# Patient Record
Sex: Male | Born: 1945 | Race: Black or African American | Hispanic: No | Marital: Married | Smoking: Never smoker
Health system: Southern US, Community
[De-identification: ages and names within clinical notes are randomized; demographics above are authoritative.]

## PROBLEM LIST (undated history)

## (undated) DIAGNOSIS — E785 Hyperlipidemia, unspecified: Secondary | ICD-10-CM

## (undated) DIAGNOSIS — K219 Gastro-esophageal reflux disease without esophagitis: Secondary | ICD-10-CM

## (undated) DIAGNOSIS — F32A Depression, unspecified: Secondary | ICD-10-CM

## (undated) DIAGNOSIS — F329 Major depressive disorder, single episode, unspecified: Secondary | ICD-10-CM

## (undated) DIAGNOSIS — G4730 Sleep apnea, unspecified: Secondary | ICD-10-CM

## (undated) HISTORY — PX: THUMB ARTHROSCOPY: SHX2509

## (undated) HISTORY — DX: Sleep apnea, unspecified: G47.30

## (undated) HISTORY — DX: Hyperlipidemia, unspecified: E78.5

## (undated) HISTORY — PX: ANKLE FRACTURE SURGERY: SHX122

## (undated) HISTORY — PX: FEMUR SURGERY: SHX943

## (undated) HISTORY — DX: Gastro-esophageal reflux disease without esophagitis: K21.9

---

## 2000-06-12 ENCOUNTER — Ambulatory Visit (HOSPITAL_COMMUNITY): Admission: RE | Admit: 2000-06-12 | Discharge: 2000-06-12 | Payer: Self-pay | Admitting: *Deleted

## 2000-06-12 ENCOUNTER — Encounter: Payer: Self-pay | Admitting: *Deleted

## 2004-02-02 ENCOUNTER — Emergency Department (HOSPITAL_COMMUNITY): Admission: EM | Admit: 2004-02-02 | Discharge: 2004-02-03 | Payer: Self-pay | Admitting: Emergency Medicine

## 2004-07-05 ENCOUNTER — Encounter: Admission: RE | Admit: 2004-07-05 | Discharge: 2004-07-05 | Payer: Self-pay | Admitting: Family Medicine

## 2004-07-20 ENCOUNTER — Ambulatory Visit (HOSPITAL_BASED_OUTPATIENT_CLINIC_OR_DEPARTMENT_OTHER): Admission: RE | Admit: 2004-07-20 | Discharge: 2004-07-20 | Payer: Self-pay | Admitting: Family Medicine

## 2004-07-22 ENCOUNTER — Ambulatory Visit: Payer: Self-pay | Admitting: Internal Medicine

## 2006-09-22 ENCOUNTER — Encounter: Admission: RE | Admit: 2006-09-22 | Discharge: 2006-09-22 | Payer: Self-pay | Admitting: Orthopedic Surgery

## 2010-06-29 NOTE — Procedures (Signed)
Dalton Kidd, PROM NO.:  [id number]   MEDICAL RECORD NO.:  [id number]          PATIENT TYPE:  OUT   LOCATION:  SLEEP CENTER                 FACILITY:  [facility]   PHYSICIAN:  Clinton D. Maple Hudson, M.D. DATE OF BIRTH:  04/20/1945   DATE OF STUDY:                              NOCTURNAL POLYSOMNOGRAM   REFERRING PHYSICIAN:  Talmadge Coventry, M.D.   INDICATIONS FOR STUDY:  Hypersomnia with sleep apnea.   Epworth Sleepiness Score 13/24, BMI 24, weight 176 pounds.   SLEEP ARCHITECTURE:  Total sleep time 377 minutes.  Sleep efficiency 92%.  Stage 1 was 3%; stage 2, 72%; stages 3 and 4 absent.  REM 25% of total sleep  time.  Sleep latency 4.5 minutes, REM latency 48 minutes.  Awake after sleep  onset 29 minutes. Arousal index 8.7.   RESPIRATORY DATA:  Respiratory disturbance index (RDI, AHI) 4.9 obstructive  events per hours which is within the upper limits of adult normal.  There  was 1 central apnea, 6 obstructive apneas, and 24 hypopneas.  The events  were not positional.  REM RDI 10.2.  Criteria for CPAP titration by split  protocol were not met.   OXYGEN DATA:  Moderate snoring with oxygen desaturation to a nadir of 84%.  Mean oxygen saturation through the study was 96% on room air.   CARDIAC DATA:  Normal sinus rhythm and sinus bradycardia, 50 to 61 beats per  minute.   MOVEMENT/PARASOMNIA:  A total of 34 limb jerks were recorded of which 9 were  associated with arousal or awakening or a periodic limb movement with  arousal index of 1.4 per hour which is relatively insignificant.   IMPRESSION/RECOMMENDATIONS:  1.  Occasional obstructive sleep disorder breathing events, RDI 4.9 per      hour, which is within the upper limits of      adult normal with no specific therapy indicated.  2.  Moderate snoring with oxygen desaturation 84%, mean saturation 96%.      Clinton D. Maple Hudson, M.D.  Diplomat    CDY/MEDQ  D:  07/22/2004 12:21:51  T:  07/22/2004 20:23:05   Job:  [id number]   cc:   Talmadge Coventry, M.D.  [address]  [place]  [place] [address]  Fax: [phone]

## 2010-08-19 ENCOUNTER — Emergency Department (HOSPITAL_COMMUNITY)
Admission: EM | Admit: 2010-08-19 | Discharge: 2010-08-19 | Disposition: A | Discharge status: Home / Self Care | Attending: Emergency Medicine | Admitting: Emergency Medicine

## 2010-08-19 DIAGNOSIS — Y9353 Activity, golf: Secondary | ICD-10-CM | POA: Insufficient documentation

## 2010-08-19 DIAGNOSIS — S058X9A Other injuries of unspecified eye and orbit, initial encounter: Secondary | ICD-10-CM | POA: Insufficient documentation

## 2010-08-19 DIAGNOSIS — IMO0002 Reserved for concepts with insufficient information to code with codable children: Secondary | ICD-10-CM | POA: Insufficient documentation

## 2013-12-03 ENCOUNTER — Ambulatory Visit (INDEPENDENT_AMBULATORY_CARE_PROVIDER_SITE_OTHER): Payer: Medicare Other | Admitting: Podiatry

## 2013-12-03 ENCOUNTER — Ambulatory Visit (INDEPENDENT_AMBULATORY_CARE_PROVIDER_SITE_OTHER): Payer: Medicare Other

## 2013-12-03 ENCOUNTER — Encounter: Payer: Self-pay | Admitting: Podiatry

## 2013-12-03 VITALS — BP 112/73 | HR 66 | Resp 16 | Ht 70.0 in | Wt 178.0 lb

## 2013-12-03 DIAGNOSIS — M21612 Bunion of left foot: Secondary | ICD-10-CM

## 2013-12-03 DIAGNOSIS — M2042 Other hammer toe(s) (acquired), left foot: Secondary | ICD-10-CM

## 2013-12-03 DIAGNOSIS — M2012 Hallux valgus (acquired), left foot: Secondary | ICD-10-CM

## 2013-12-03 DIAGNOSIS — M779 Enthesopathy, unspecified: Secondary | ICD-10-CM

## 2013-12-03 NOTE — Progress Notes (Signed)
   Subjective:    Patient ID: Dalton Kidd, male    DOB: 12/25/1945, 68 y.o.   MRN: [id number]  HPI Comments: "I have pain underneath."  Presents the office today with complaints of left foot pain. He states to the area submetatarsal 2. He states that he has pain in this area particularly with pressure and walking. He states that the area on the bottom of his foot feels thin and there is no padding. States the second digit is overlapping his big toe which makes his shoes uncomfortable. He said no prior treatment. He previously has had inserts made by the [facility]. Denies any recent injury or trauma to the area. No other complaints at this time.  Foot Pain Associated symptoms include arthralgias.      Review of Systems  Endocrine: Positive for polyuria.  Musculoskeletal: Positive for arthralgias and back pain.  All other systems reviewed and are negative.      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 This significant contracture of the second digit. There is prominence of the second metatarsal head plantarly. There is atrophy of the plantar fat pad. There is mild tenderness to palpation over the second metatarsal head plantarly. No pain with vibratory sensation. There is no pain or crepitation within the first MTPJ. No pain with range of motion of the second MPJ. MMT 5/5, ROM WNL No calf pain with compression, swelling, warmth. No open lesions.        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 -X-rays were obtained and reviewed the patient. -Conservative versus surgical treatment discussed including alternatives,  risks, complications. -At this time we'll proceed with conservative treatment. Offloading pads were dispensed to help offload the prominent metatarsal head plantarly. Also discussed the patient he can take his inserts to wear they were made to have them modified to help offload the area. Also discussed a higher toe box shoe. -Patient does state that if symptoms persist he would like to proceed with surgical intervention. Discussed with him this would most likely require bunion correction involving distal procedure with possible Akin osteotomy, second metatarsal shortening osteotomy and second digit hammertoe repair. -Followup as needed. Call the office with any questions, concerns, change in symptoms.

## 2013-12-04 ENCOUNTER — Encounter: Payer: Self-pay | Admitting: Podiatry

## 2013-12-04 NOTE — Patient Instructions (Signed)
Bunionectomy A bunionectomy is surgery to remove a bunion. A bunion is an enlargement of the joint at the base of the big toe. It is made up of bone and soft tissue on the inside part of the joint. Over time, a painful lump appears on the inside of the joint. The big toe begins to point inward toward the second toe. New bone growth can occur and a bone spur may form. The pain eventually causes difficulty walking. A bunion usually results from inflammation caused by the irritation of poorly fitting shoes. It often begins later in life. A bunionectomy is performed when nonsurgical treatment no longer works. When surgery is needed, the extent of the procedure will depend on the degree of deformity of the foot. Your surgeon will discuss with you the different procedures and what will work best for you depending on your age and health. LET YOUR CAREGIVER KNOW ABOUT:   Previous problems with anesthetics or medicines used to numb the skin.  Allergies to dyes, iodine, foods, and/or latex.  Medicines taken including herbs, eye drops, prescription medicines (especially medicines used to "thin the blood"), aspirin and other over-the-counter medicines, and steroids (by mouth or as a cream).  History of bleeding or blood problems.  Possibility of pregnancy, if this applies.  History of blood clots in your legs and/or lungs .  Previous surgery.  Other important health problems. RISKS AND COMPLICATIONS   Infection.  Pain.  Nerve damage.  Possibility that the bunion will recur. BEFORE THE PROCEDURE  You should be present 60 minutes prior to your procedure or as directed.  PROCEDURE  Surgery is often done so that you can go home the same day (outpatient). It may be done in a hospital or in an outpatient surgical center. An anesthetic will be used to help you sleep during the procedure. Sometimes, a spinal anesthetic is used to make you numb below the waist. A cut (incision) is made over the swollen  area at the first joint of the big toe. The enlarged lump will be removed. If there is a need to reposition the bones of the big toe, this may require more than 1 incision. The bone itself may need to be cut. Screws and wires may be used in the repair. These can be removed at a later date. In severe cases, the entire joint may need to be removed and a joint replacement inserted. When done, the incision is closed with stitches (sutures). Skin adhesive strips may be added for reinforcement. They help hold the incision closed.  AFTER THE PROCEDURE  Compression bandages (dressings) are then wrapped around the wound. This helps to keep the foot in alignment and reduce swelling. Your foot will be monitored for bleeding and swelling. You will need to stay for a few hours in the recovery area before being discharged. This allows time for the anesthesia to wear off. You will be discharged home when you are awake, stable, and doing well. HOME CARE INSTRUCTIONS   You can expect to return to normal activities within 6 to 8 weeks after surgery. The foot is at increased risk for swelling for several months. When you can expect to bear weight on the operated foot will depend on the extent of your surgery. The milder the deformity, the less tissue is removed and the sooner the return to normal activity level. During the recovery period, a special shoe, boot, or cast may be worn to accommodate the surgical bandage and to help provide stability   to the foot.  Once you are home, an ice pack applied to the operative site may help with discomfort and keep swelling down. Stop using the ice if it causes discomfort.  Keep your feet raised (elevated) when possible to lessen swelling.  If you have an elastic bandage on your foot and you have numbness, tingling, or your foot becomes cold and blue, adjust the bandage to make it comfortable.  Change dressings as directed.  Keep the wound dry and clean. The wound may be washed  gently with soap and water. Gently blot dry without rubbing. Do not take baths or use swimming pools or hot tubs for 10 days, or as instructed by your caregiver.  Only take over-the-counter or prescription medicines for pain, discomfort, or fever as directed by your caregiver.  You may continue a normal diet as directed.  For activity, use crutches with no weight bearing or your orthopedic shoe as directed. Continue to use crutches or a cane as directed until you can stand without causing pain. SEEK MEDICAL CARE IF:   You have redness, swelling, bruising, or increasing pain in the wound.  There is pus coming from the wound.  You have drainage from a wound lasting longer than 1 day.  You have an oral temperature above 102 F (38.9 C).  You notice a bad smell coming from the wound or dressing.  The wound breaks open after sutures have been removed.  You develop dizzy episodes or fainting while standing.  You have persistent nausea or vomiting.  Your toes become cold.  Pain is not relieved with medicines. SEEK IMMEDIATE MEDICAL CARE IF:   You develop a rash.  You have difficulty breathing.  You develop any reaction or side effects to medicines given.  Your toes are numb or blue, or you have severe pain. MAKE SURE YOU:   Understand these instructions.  Will watch your condition.  Will get help right away if you are not doing well or get worse. Document Released: 01/11/2005 Document Revised: 04/22/2011 Document Reviewed: 02/16/2007 ExitCare Patient Information 2015 [employer]. This information is not intended to replace advice given to you by your health care provider. Make sure you discuss any questions you have with your health care provider.  

## 2014-07-02 ENCOUNTER — Emergency Department (HOSPITAL_COMMUNITY)
Admission: EM | Admit: 2014-07-02 | Discharge: 2014-07-02 | Disposition: A | Discharge status: Home / Self Care | Payer: Medicare Other | Attending: Emergency Medicine | Admitting: Emergency Medicine

## 2014-07-02 ENCOUNTER — Encounter (HOSPITAL_COMMUNITY): Payer: Self-pay | Admitting: *Deleted

## 2014-07-02 DIAGNOSIS — X58XXXA Exposure to other specified factors, initial encounter: Secondary | ICD-10-CM | POA: Insufficient documentation

## 2014-07-02 DIAGNOSIS — S0502XA Injury of conjunctiva and corneal abrasion without foreign body, left eye, initial encounter: Secondary | ICD-10-CM

## 2014-07-02 DIAGNOSIS — F329 Major depressive disorder, single episode, unspecified: Secondary | ICD-10-CM | POA: Diagnosis not present

## 2014-07-02 DIAGNOSIS — Y9389 Activity, other specified: Secondary | ICD-10-CM | POA: Diagnosis not present

## 2014-07-02 DIAGNOSIS — Y998 Other external cause status: Secondary | ICD-10-CM | POA: Diagnosis not present

## 2014-07-02 DIAGNOSIS — Z79899 Other long term (current) drug therapy: Secondary | ICD-10-CM | POA: Insufficient documentation

## 2014-07-02 DIAGNOSIS — S0592XA Unspecified injury of left eye and orbit, initial encounter: Secondary | ICD-10-CM | POA: Diagnosis present

## 2014-07-02 DIAGNOSIS — Y9289 Other specified places as the place of occurrence of the external cause: Secondary | ICD-10-CM | POA: Insufficient documentation

## 2014-07-02 HISTORY — DX: Major depressive disorder, single episode, unspecified: F32.9

## 2014-07-02 HISTORY — DX: Depression, unspecified: F32.A

## 2014-07-02 MED ORDER — ERYTHROMYCIN 5 MG/GM OP OINT
TOPICAL_OINTMENT | Freq: Four times a day (QID) | OPHTHALMIC | Status: DC
  Administered 2014-07-02: 11:00:00 via OPHTHALMIC
  Filled 2014-07-02: qty 3.5

## 2014-07-02 MED ORDER — FLUORESCEIN SODIUM 1 MG OP STRP
1.0000 | ORAL_STRIP | Freq: Once | OPHTHALMIC | Status: AC
  Administered 2014-07-02: 1 via OPHTHALMIC
  Filled 2014-07-02: qty 1

## 2014-07-02 MED ORDER — TETRACAINE HCL 0.5 % OP SOLN
2.0000 [drp] | Freq: Once | OPHTHALMIC | Status: AC
  Administered 2014-07-02: 2 [drp] via OPHTHALMIC
  Filled 2014-07-02: qty 2

## 2014-07-02 NOTE — ED Provider Notes (Signed)
CSN: [id number]     Arrival date & time 07/02/14  0815 History   First MD Initiated Contact with Patient 07/02/14 [phone]     Chief Complaint  Patient presents with  . Eye Pain     (Consider location/radiation/quality/duration/timing/severity/associated sxs/prior Treatment) HPI Dalton Kidd is a 69 year old male with past medical history of depression who presents the ER complaining of left irritation. Patient reports a gradual onset of the symptoms and the sensation of foreign body from the past 2 days in his left eye. Patient reports associated clear drainage and irritation when blinking. Patient denies visual acuity change, headache, eye pain, blurred vision, dizziness, weakness, loss of vision, fever. Patient reports mild photophobia.  Past Medical History  Diagnosis Date  . Depression    Past Surgical History  Procedure Laterality Date  . Ankle fracture surgery     No family history on file. History  Substance Use Topics  . Smoking status: Never Smoker   . Smokeless tobacco: Not on file  . Alcohol Use: No    Review of Systems  Constitutional: Negative for fever.  Eyes: Positive for photophobia, discharge, redness and itching. Negative for visual disturbance.  Respiratory: Negative for shortness of breath.   Cardiovascular: Negative for chest pain.  Gastrointestinal: Negative for nausea, vomiting and abdominal pain.  Genitourinary: Negative for dysuria.  Skin: Negative for rash.  Neurological: Negative for dizziness, syncope, weakness and numbness.  Psychiatric/Behavioral: Negative.       Allergies  Review of patient's allergies indicates no known allergies.  Home Medications   Prior to Admission medications   Medication Sig Start Date End Date Taking? Authorizing Provider  Artificial Tear Ointment (ARTIFICIAL TEARS) ointment Place 1 application into both eyes at bedtime.   Yes Historical Provider, MD  buPROPion (WELLBUTRIN XL) 300 MG 24 hr tablet Take 300 mg by  mouth daily.   Yes Historical Provider, MD  carboxymethylcellulose (REFRESH PLUS) 0.5 % SOLN Place 1 drop into both eyes 3 (three) times daily as needed (for dry eyes).   Yes Historical Provider, MD  cycloSPORINE (RESTASIS) 0.05 % ophthalmic emulsion Place 1 drop into both eyes 2 (two) times daily.   Yes Historical Provider, MD  loratadine (CLARITIN) 10 MG tablet Take 10 mg by mouth daily.   Yes Historical Provider, MD  Multiple Vitamin (MULTIVITAMIN WITH MINERALS) TABS tablet Take 1 tablet by mouth daily.   Yes Historical Provider, MD   BP 112/67 mmHg  Pulse 63  Temp(Src) 97.6 F (36.4 C) (Oral)  Resp 17  SpO2 99% Physical Exam  Constitutional: He is oriented to person, place, and time. He appears well-developed and well-nourished. No distress.  HENT:  Head: Normocephalic and atraumatic.  Eyes: EOM are normal. Pupils are equal, round, and reactive to light. Lids are everted and swept, no foreign bodies found. Right eye exhibits no discharge. Left eye exhibits discharge. Left eye exhibits no chemosis, no exudate and no hordeolum. No foreign body present in the left eye. Left conjunctiva is injected. Left conjunctiva has no hemorrhage. No scleral icterus. Right eye exhibits normal extraocular motion and no nystagmus. Left eye exhibits normal extraocular motion and no nystagmus.  Slit lamp exam:      The right eye shows no fluorescein uptake and no anterior chamber bulge.       The left eye shows corneal abrasion and fluorescein uptake. The left eye shows no corneal flare, no corneal ulcer, no foreign body, no hyphema, no hypopyon and no anterior chamber bulge.  Small,  punctate lesion of forcing uptake noted on lower pole of the cornea. EOMs intact. Pupils equal round reactive to light. No consensual photophobia.  Neck: Normal range of motion and full passive range of motion without pain. Neck supple. No spinous process tenderness and no muscular tenderness present. No rigidity. No edema, no  erythema and normal range of motion present. No Brudzinski's sign and no Kernig's sign noted.  Pulmonary/Chest: Effort normal. No respiratory distress.  Musculoskeletal: Normal range of motion.  Neurological: He is alert and oriented to person, place, and time.  Skin: Skin is warm and dry. He is not diaphoretic.  Psychiatric: He has a normal mood and affect.  Nursing note and vitals reviewed.   ED Course  Procedures (including critical care time) Labs Review Labs Reviewed - No data to display  Imaging Review No results found.   EKG Interpretation None      MDM   Final diagnoses:  Corneal abrasion, left, initial encounter    Corneal abrasion  Pt with corneal abrasion on PE.  No evidence of FB.  No change in vision, acuity equal bilaterally.  Pt is not a contact lens wearer.  Exam non-concerning for orbital cellulitis, hyphema, corneal ulcers. Patient will be discharged home with erythromycin.   Patient understands to follow up with ophthalmology, & to return to ER if new symptoms develop including change in vision, purulent drainage, or entrapment.  BP 112/67 mmHg  Pulse 63  Temp(Src) 97.6 F (36.4 C) (Oral)  Resp 17  SpO2 99%  Signed,  Ladona MowJoe Benn Tarver, PA-C 11:28 AM  Patient seen and discussed with Dr. Gray BernhardtElliot Wentz, MD     Ladona MowJoe Nonnie Pickney, PA-C 07/02/14 1128  Mancel BaleElliott Wentz, MD 07/02/14 1535

## 2014-07-02 NOTE — ED Notes (Signed)
Pt reports L eye pain, drainage, foreign body sensation x2 days. Has been using Rx eye drops from the [facility] without relief.

## 2014-07-02 NOTE — Discharge Instructions (Signed)
Use antibiotic ointment as prescribed. Follow-up with ophthalmology should symptoms worsen or not improve in the next 3-5 days. Return to the ER if any severe pain, loss of vision, blurred vision, dizziness, severe headache, high fever.  Corneal Abrasion The cornea is the clear covering at the front and center of the eye. When looking at the colored portion of the eye (iris), you are looking through the cornea. This very thin tissue is made up of many layers. The surface layer is a single layer of cells (corneal epithelium) and is one of the most sensitive tissues in the body. If a scratch or injury causes the corneal epithelium to come off, it is called a corneal abrasion. If the injury extends to the tissues below the epithelium, the condition is called a corneal ulcer. CAUSES   Scratches.  Trauma.  Foreign body in the eye. Some people have recurrences of abrasions in the area of the original injury even after it has healed (recurrent erosion syndrome). Recurrent erosion syndrome generally improves and goes away with time. SYMPTOMS   Eye pain.  Difficulty or inability to keep the injured eye open.  The eye becomes very sensitive to light.  Recurrent erosions tend to happen suddenly, first thing in the morning, usually after waking up and opening the eye. DIAGNOSIS  Your health care provider can diagnose a corneal abrasion during an eye exam. Dye is usually placed in the eye using a drop or a small paper strip moistened by your tears. When the eye is examined with a special light, the abrasion shows up clearly because of the dye. TREATMENT   Small abrasions may be treated with antibiotic drops or ointment alone.  A pressure patch may be put over the eye. If this is done, follow your doctor's instructions for when to remove the patch. Do not drive or use machines while the eye patch is on. Judging distances is hard to do with a patch on. If the abrasion becomes infected and spreads to the  deeper tissues of the cornea, a corneal ulcer can result. This is serious because it can cause corneal scarring. Corneal scars interfere with light passing through the cornea and cause a loss of vision in the involved eye. HOME CARE INSTRUCTIONS  Use medicine or ointment as directed. Only take over-the-counter or prescription medicines for pain, discomfort, or fever as directed by your health care provider.  Do not drive or operate machinery if your eye is patched. Your ability to judge distances is impaired.  If your health care provider has given you a follow-up appointment, it is very important to keep that appointment. Not keeping the appointment could result in a severe eye infection or permanent loss of vision. If there is any problem keeping the appointment, let your health care provider know. SEEK MEDICAL CARE IF:   You have pain, light sensitivity, and a scratchy feeling in one eye or both eyes.  Your pressure patch keeps loosening up, and you can blink your eye under the patch after treatment.  Any kind of discharge develops from the eye after treatment or if the lids stick together in the morning.  You have the same symptoms in the morning as you did with the original abrasion days, weeks, or months after the abrasion healed. MAKE SURE YOU:   Understand these instructions.  Will watch your condition.  Will get help right away if you are not doing well or get worse. Document Released: 01/26/2000 Document Revised: 02/02/2013 Document Reviewed: 10/05/2012  ExitCare® Patient Information ©2015 [employer]. This information is not intended to replace advice given to you by your health care provider. Make sure you discuss any questions you have with your health care provider. ° °

## 2014-07-02 NOTE — ED Provider Notes (Signed)
  Face-to-face evaluation   History: Eye drainage, without known trauma. Mild photosensitivity.  Physical exam: Left eye with mild bulbar conjunctivitis with clear drainage, central corneal abrasion noted on evaluation after fluorescein staining and exam with slit lamp. No foreign body.  Medical screening examination/treatment/procedure(s) were conducted as a shared visit with non-physician practitioner(s) and myself.  I personally evaluated the patient during the encounter  Mancel BaleElliott Gailen Venne, MD 07/02/14 1535

## 2018-02-17 ENCOUNTER — Other Ambulatory Visit: Payer: Self-pay | Admitting: Sports Medicine

## 2018-02-17 DIAGNOSIS — M5412 Radiculopathy, cervical region: Secondary | ICD-10-CM

## 2018-02-23 ENCOUNTER — Ambulatory Visit
Admission: RE | Admit: 2018-02-23 | Discharge: 2018-02-23 | Disposition: A | Discharge status: Home / Self Care | Payer: Medicare HMO | Source: Ambulatory Visit | Attending: Sports Medicine | Admitting: Sports Medicine

## 2018-02-23 DIAGNOSIS — M5412 Radiculopathy, cervical region: Secondary | ICD-10-CM

## 2018-02-23 MED ORDER — ONDANSETRON HCL 4 MG/2ML IJ SOLN
4.0000 mg | Freq: Once | INTRAMUSCULAR | Status: AC
  Administered 2018-02-23: 4 mg via INTRAMUSCULAR

## 2018-02-23 MED ORDER — IOPAMIDOL (ISOVUE-M 300) INJECTION 61%
10.0000 mL | Freq: Once | INTRAMUSCULAR | Status: AC | PRN
  Administered 2018-02-23: 10 mL via INTRATHECAL

## 2018-02-23 MED ORDER — MEPERIDINE HCL 50 MG/ML IJ SOLN
50.0000 mg | Freq: Once | INTRAMUSCULAR | Status: AC
  Administered 2018-02-23: 50 mg via INTRAMUSCULAR

## 2018-02-23 MED ORDER — DIAZEPAM 5 MG PO TABS
5.0000 mg | ORAL_TABLET | Freq: Once | ORAL | Status: AC
  Administered 2018-02-23: 5 mg via ORAL

## 2018-02-23 NOTE — Progress Notes (Signed)
Patient states he has been off Wellbutrin for at least the past two days.  Larina Earthly, RN

## 2018-02-23 NOTE — Discharge Instructions (Signed)
Myelogram Discharge Instructions  1. Go home and rest quietly for the next 24 hours.  It is important to lie flat for the next 24 hours.  Get up only to go to the restroom.  You may lie in the bed or on a couch on your back, your stomach, your left side or your right side.  You may have one pillow under your head.  You may have pillows between your knees while you are on your side or under your knees while you are on your back.  2. DO NOT drive today.  Recline the seat as far back as it will go, while still wearing your seat belt, on the way home.  3. You may get up to go to the bathroom as needed.  You may sit up for 10 minutes to eat.  You may resume your normal diet and medications unless otherwise indicated.  Drink lots of extra fluids today and tomorrow.  4. The incidence of headache, nausea, or vomiting is about 5% (one in 20 patients).  If you develop a headache, lie flat and drink plenty of fluids until the headache goes away.  Caffeinated beverages may be helpful.  If you develop severe nausea and vomiting or a headache that does not go away with flat bed rest, call [phone].  5. You may resume normal activities after your 24 hours of bed rest is over; however, do not exert yourself strongly or do any heavy lifting tomorrow. If when you get up you have a headache when standing, go back to bed and force fluids for another 24 hours.  6. Call your physician for a follow-up appointment.  The results of your myelogram will be sent directly to your physician by the following day.  7. If you have any questions or if complications develop after you arrive home, please call [phone].  Discharge instructions have been explained to the patient.  The patient, or the person responsible for the patient, fully understands these instructions.  YOU MAY RESTART YOUR [facility] TOMORROW 02/24/2018 AT 10:30AM.

## 2018-08-03 ENCOUNTER — Other Ambulatory Visit: Payer: Self-pay | Admitting: *Deleted

## 2018-08-03 DIAGNOSIS — Z20822 Contact with and (suspected) exposure to covid-19: Secondary | ICD-10-CM

## 2018-08-09 LAB — NOVEL CORONAVIRUS, NAA: SARS-CoV-2, NAA: NOT DETECTED

## 2018-08-10 ENCOUNTER — Telehealth: Payer: Self-pay | Admitting: General Practice

## 2018-08-10 NOTE — Telephone Encounter (Signed)
Patient was notified that his COVID results where negative.

## 2020-11-14 ENCOUNTER — Encounter: Payer: Self-pay | Admitting: Pulmonary Disease

## 2020-11-14 ENCOUNTER — Ambulatory Visit (INDEPENDENT_AMBULATORY_CARE_PROVIDER_SITE_OTHER): Payer: No Typology Code available for payment source | Admitting: Pulmonary Disease

## 2020-11-14 ENCOUNTER — Ambulatory Visit
Admission: RE | Admit: 2020-11-14 | Discharge: 2020-11-14 | Disposition: A | Discharge status: Home / Self Care | Payer: Self-pay | Source: Ambulatory Visit | Attending: Pulmonary Disease | Admitting: Pulmonary Disease

## 2020-11-14 ENCOUNTER — Other Ambulatory Visit: Payer: Self-pay

## 2020-11-14 VITALS — BP 140/82 | HR 78 | Temp 98.0°F | Ht 70.5 in | Wt 194.4 lb

## 2020-11-14 DIAGNOSIS — R9389 Abnormal findings on diagnostic imaging of other specified body structures: Secondary | ICD-10-CM

## 2020-11-14 DIAGNOSIS — R911 Solitary pulmonary nodule: Secondary | ICD-10-CM | POA: Diagnosis not present

## 2020-11-14 NOTE — Patient Instructions (Addendum)
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 LIP in differential --ORDER serologic testing: HIV, antinuclear antibody, anti-Ro/SSA, anti-La/SSB, rheumatoid factor --Monitor on follow-up scan as noted above  Follow-up with me in December 5th 2022

## 2020-11-14 NOTE — Progress Notes (Signed)
Subjective:   PATIENT ID: Dalton Kidd GENDER: male DOB: 1946/01/18, MRN: [id number]   HPI  Chief Complaint  Patient presents with   Consult    [facility]", has a dry cough   Reason for Visit: New consult for   [name] is a 75 year old male never smoker with OSA s/p Inspire in 2018 w  Synopsis: He was previously seen by Dr. Shelle Iron at the [facility]. He was referred to [facility] for abnormal CT showing cystic lung disease and right upper lobe nodule measuring 60mm. Note by his PCP Dorise Bullion FNP 08/28/20 reviewed. He previously had PFTs in 04/2020. Listed pulmonary related problems include asthma, allergic rhinitis and sleep apnea. HR CT 09/29/20 was obtained for incidental lung cysts found during his urogram for hematuria.  He has never had a scan before this year. Denies history of asthma. Reports OSA is well-controlled. He is living independently. Able to perform regular activity including household and yard work. Denies shortness of breath or wheezing. Occasional cough, non-productive. He reports history reflux and regurgitation with some meals and he is on an anti-reflux daily that has helped resolve.   Denies childhood respiratory symptoms/illnesses. Denies family hx of autoimmune disease  Social History: [employer] - lived in [place] 3-5 months Tried cigarettes but never smoked. Never vaped. 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  Past Medical History:  Diagnosis Date   Depression    Hyperlipidemia    Sleep apnea      Family History  Problem Relation Age of Onset   Diabetes Mother    Cancer Brother     Social History   Occupational History   Not on file  Tobacco Use   Smoking status: Never    Smokeless tobacco: Never  Substance and Sexual Activity   Alcohol use: No   Drug use: No   Sexual activity: Not on file    No Known Allergies   Outpatient Medications Prior to Visit  Medication Sig Dispense Refill   Artificial Tear Ointment (ARTIFICIAL TEARS) ointment Place 1 application into both eyes at bedtime.     buPROPion (WELLBUTRIN XL) 300 MG 24 hr tablet Take 300 mg by mouth daily.     carboxymethylcellulose (REFRESH PLUS) 0.5 % SOLN Place 1 drop into both eyes 3 (three) times daily as needed (for dry eyes).     loratadine (CLARITIN) 10 MG tablet Take 10 mg by mouth daily as needed.     meloxicam (MOBIC) 15 MG tablet Take 15 mg by mouth daily.     Multiple Vitamin (MULTIVITAMIN WITH MINERALS) TABS tablet Take 1 tablet by mouth daily.     rosuvastatin (CRESTOR) 40 MG tablet TAKE ONE-HALF TABLET BY MOUTH ONCE A DAY FOR CHOLESTEROL     No facility-administered medications prior to visit.    Review of Systems  Constitutional:  Negative for chills, diaphoresis, fever, malaise/fatigue and weight loss.  HENT:  Negative for congestion, ear pain and sore throat.   Respiratory:  Negative for cough, hemoptysis, sputum production, shortness of breath and wheezing.   Cardiovascular:  Negative for chest pain, palpitations and leg swelling.  Gastrointestinal:  Positive for heartburn. Negative for abdominal pain and nausea.  Genitourinary:  Positive for hematuria. Negative for frequency.  Musculoskeletal:  Positive  for back pain. Negative for joint pain and myalgias.  Skin:  Negative for itching and rash.  Neurological:  Negative for dizziness, weakness and headaches.  Endo/Heme/Allergies:  Does not bruise/bleed easily.  Psychiatric/Behavioral:  Negative for depression. The patient is not nervous/anxious.     Objective:   Vitals:   11/14/20 1122  BP: 140/82  Pulse: 78  Temp: 98 F (36.7 C)  TempSrc: Oral  SpO2: 99%  Weight: 194 lb 6.4 oz (88.2 kg)  Height: 5' 10.5" (1.791 m)    SpO2: 99 % O2 Device: None (Room air)  Physical Exam: General: Well-appearing, no acute distress HENT: [place], AT Eyes: EOMI, no scleral icterus Respiratory: Clear to auscultation bilaterally.  No crackles, wheezing or rales Cardiovascular: RRR, -M/R/G, no JVD Extremities:-Edema,-tenderness Neuro: AAO x4, CNII-XII grossly intact Psych: Normal mood, normal affect  Data Reviewed:  Imaging:   CT Chest 09/28/20 - Right upper lobe nodule ~1cm with diffuse scattered thin walled cysts throughout lungs bilaterally     PFT: None on file  Labs: CBC No results found for: WBC, RBC, HGB, HCT, PLT, MCV, MCH, MCHC, RDW, LYMPHSABS, MONOABS, EOSABS, BASOSABS     Assessment & Plan:   Discussion: 75 year old male never smoker with OSA s/p Inspire who presents for pulmonary consult for abnormal CT showing diffuse scattered thin walled cysts throughout lungs bilaterally and nodule of the RUL. We reviewed CT and discussed intermediate risk of malignancy. After discussion will plan for follow-up CT within 3-6 months. If interval enlargement of lung nodule will consider PET/CT. Regarding his cysts, will obtain autoimmune work-up as etiology for this is unclear. Otherwise he is not symptomatic from a respiratory standpoint. No indication for PFTs at this time.  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 LIP in differential --ORDER serologic testing: HIV, antinuclear antibody, anti-Ro/SSA, anti-La/SSB, rheumatoid factor --Monitor on follow-up scan as noted above  Health Maintenance Immunization History  Administered Date(s) Administered   Moderna Sars-Covid-2 Vaccination 03/18/2019, 04/15/2019, 12/16/2019, 05/22/2020   CT Lung Screen - not indicated. No significant tobacco history  Orders Placed This Encounter  Procedures   CT Chest Wo  Contrast    Please schedule next available prior to 12/5 appointment    Standing Status:   Future    Standing Expiration Date:   11/14/2021    Order Specific Question:   Preferred imaging location?    Answer:   [place] [facility] - [address]   CT OUTSIDE FILMS CHEST    Standing Status:   Future    Number of Occurrences:   1    Standing Expiration Date:   11/14/2021    Scheduling Instructions:     Please upload images from disc in pt's chart so we can be able to view them from canopy. Please return disc back to [facility] once uploaded.    Order Specific Question:   Where was this CD imported?    Answer:   [address]   HIV antibody (with reflex)    Standing Status:   Future    Number of Occurrences:   1    Standing Expiration Date:   11/14/2021   ANA    Standing Status:   Future    Number of Occurrences:   1    Standing Expiration Date:   11/14/2021   Rheumatoid Factor    Standing Status:  Future    Number of Occurrences:   1    Standing Expiration Date:   11/14/2021   Cyclic citrul peptide antibody, IgG    Standing Status:   Future    Number of Occurrences:   1    Standing Expiration Date:   11/14/2021   Sjogrens syndrome-B extractable nuclear antibody    Standing Status:   Future    Number of Occurrences:   1    Standing Expiration Date:   11/14/2021   Sjogrens syndrome-A extractable nuclear antibody    Standing Status:   Future    Number of Occurrences:   1    Standing Expiration Date:   11/14/2021  No orders of the defined types were placed in this encounter.   Return in about 2 months (around 01/15/2021).  I have spent a total time of 45-minutes on the day of the appointment reviewing prior documentation, coordinating care and discussing medical diagnosis and plan with the patient/family. Imaging, labs and tests included in this note have been reviewed and interpreted independently by me.  Lesleigh Hughson Mechele Collin, MD [facility] 11/14/2020 1:39 PM  Office  Number [phone]

## 2020-11-15 LAB — SJOGRENS SYNDROME-A EXTRACTABLE NUCLEAR ANTIBODY: SSA (Ro) (ENA) Antibody, IgG: <1 AI

## 2020-11-16 LAB — RHEUMATOID FACTOR: Rhuematoid fact SerPl-aCnc: <14 IU/mL (ref <14)

## 2020-11-16 LAB — ANA: Anti Nuclear Antibody (ANA): NEGATIVE

## 2020-11-16 LAB — SJOGRENS SYNDROME-B EXTRACTABLE NUCLEAR ANTIBODY: SSB (La) (ENA) Antibody, IgG: <1 AI

## 2020-11-16 LAB — CYCLIC CITRUL PEPTIDE ANTIBODY, IGG: Cyclic Citrullin Peptide Ab: <16 UNITS

## 2020-11-16 LAB — HIV ANTIBODY (ROUTINE TESTING W REFLEX): HIV 1&2 Ab, 4th Generation: NONREACTIVE

## 2020-12-05 ENCOUNTER — Telehealth: Payer: Self-pay | Admitting: Pulmonary Disease

## 2020-12-05 NOTE — Telephone Encounter (Signed)
HRCT from [facility] uploaded to canopy.  CD sent to office for Patient to pick up.   Spoke with Patient's Wife Erskine Squibb (DPR). Erskine Squibb stated she would give Patient message to pick up CD.  CD placed at front desk for pick up. Call back number left for Patient questions.

## 2021-01-09 ENCOUNTER — Ambulatory Visit (INDEPENDENT_AMBULATORY_CARE_PROVIDER_SITE_OTHER)
Admission: RE | Admit: 2021-01-09 | Discharge: 2021-01-09 | Disposition: A | Discharge status: Home / Self Care | Payer: No Typology Code available for payment source | Source: Ambulatory Visit | Attending: Pulmonary Disease | Admitting: Pulmonary Disease

## 2021-01-09 ENCOUNTER — Other Ambulatory Visit: Payer: Self-pay

## 2021-01-09 DIAGNOSIS — R911 Solitary pulmonary nodule: Secondary | ICD-10-CM | POA: Diagnosis not present

## 2021-01-09 DIAGNOSIS — R9389 Abnormal findings on diagnostic imaging of other specified body structures: Secondary | ICD-10-CM | POA: Diagnosis not present

## 2021-01-15 ENCOUNTER — Ambulatory Visit (INDEPENDENT_AMBULATORY_CARE_PROVIDER_SITE_OTHER): Payer: No Typology Code available for payment source | Admitting: Pulmonary Disease

## 2021-01-15 ENCOUNTER — Other Ambulatory Visit: Payer: Self-pay

## 2021-01-15 ENCOUNTER — Encounter: Payer: Self-pay | Admitting: Pulmonary Disease

## 2021-01-15 VITALS — BP 132/62 | HR 77 | Temp 98.2°F | Ht 70.0 in | Wt 189.6 lb

## 2021-01-15 DIAGNOSIS — R0602 Shortness of breath: Secondary | ICD-10-CM | POA: Diagnosis not present

## 2021-01-15 DIAGNOSIS — R911 Solitary pulmonary nodule: Secondary | ICD-10-CM | POA: Diagnosis not present

## 2021-01-15 DIAGNOSIS — J984 Other disorders of lung: Secondary | ICD-10-CM

## 2021-01-15 NOTE — Patient Instructions (Addendum)
Shortness of breath Diffuse cystic lung disease --ORDER pulmonary function test for January 2023 with follow-up with me  Right upper lung nodule, 1cm --SCHEDULE CT Chest in 6 months (June 2023) and follow-up with me

## 2021-01-15 NOTE — Progress Notes (Signed)
Subjective:   PATIENT ID: Dalton Kidd GENDER: male DOB: 1945-09-06, MRN: [id number]   HPI  Chief Complaint  Patient presents with   Follow-up    Lung nodule Had flu 2 wks ago and doing better now   Reason for Visit: Follow-up  [name] is a 75 year old male never smoker with OSA s/p Inspire in 2018 who presents for follow-up  Synopsis: He was previously seen by Dr. Shelle Iron at the [facility]. Listed pulmonary related problems include asthma, allergic rhinitis and sleep apnea. He was referred to [facility] in 2022 for CT with incidental cystic lung disease and right upper lobe nodule measuring 42mm. He previously had PFTs in 04/2020.   At baseline he is living independently. Able to perform regular activity including household and yard work. Denies shortness of breath or wheezing. Occasional cough, non-productive. He reports history reflux and regurgitation with some meals and he is on an anti-reflux daily that has helped resolve.   01/15/21 He recently had the flu three weeks ago but has since recovered at home. Tested positive at the [place]. He reports that sometimes has difficulty getting enough breath to speak. It is brief <1s but he feels he has to quickly breath. No wheezing or coughing. No issues at night or when laying down. His activities are not limited.  Social History: [employer] - lived in [place] 3-5 months Tried cigarettes but never smoked. Never vaped. 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     buPROPion (WELLBUTRIN XL) 300 MG 24 hr tablet Take 300 mg by mouth daily.     carboxymethylcellulose (REFRESH PLUS) 0.5 % SOLN Place 1 drop into both eyes 3 (three) times daily as needed (for dry eyes).     loratadine (CLARITIN) 10 MG tablet Take 10 mg by mouth daily as needed.     meloxicam (MOBIC) 15 MG tablet Take 15 mg by mouth daily.     Multiple Vitamin (MULTIVITAMIN WITH MINERALS) TABS tablet Take 1 tablet by mouth daily.     rosuvastatin (CRESTOR) 40 MG tablet TAKE ONE-HALF TABLET BY MOUTH ONCE A DAY FOR CHOLESTEROL     No facility-administered medications prior to visit.    Review of Systems  Constitutional:  Negative for chills, diaphoresis, fever, malaise/fatigue and weight loss.  HENT:  Negative for congestion.   Respiratory:  Positive for shortness of breath. Negative for cough, hemoptysis, sputum production and wheezing.   Cardiovascular:  Negative for chest pain, palpitations and leg swelling.    Objective:   Vitals:   01/15/21 0909  BP: 132/62  Pulse: 77  Temp: 98.2 F (36.8 C)  TempSrc: Oral  SpO2: 98%  Weight: 189 lb 9.6 oz (86 kg)  Height: 5\' 10"  (1.778 m)   SpO2: 98 % O2 Device: None (Room air)  Physical Exam: General: Well-appearing, no acute distress HENT: [place], AT Eyes: EOMI, no scleral icterus Respiratory: Clear to auscultation bilaterally.  No crackles, wheezing or rales Cardiovascular: RRR, -M/R/G, no JVD Extremities:-Edema,-tenderness Neuro: AAO x4, CNII-XII grossly intact Psych: Normal mood, normal affect  Data Reviewed:  Imaging:   CT Chest 09/28/20 - Right upper lobe nodule ~1cm with diffuse scattered thin walled cysts throughout lungs bilaterally     CT Chest 01/09/21 - Unchanged  cystic/bronchiectatic lesions. RUL nodule similar size ~1cm  PFT: None on file  Labs: CBC No results found for: WBC, RBC, HGB, HCT, PLT, MCV, MCH, MCHC, RDW, LYMPHSABS, MONOABS, EOSABS, BASOSABS     Assessment & Plan:   Discussion: 75 year old male never smoker with diffuse cystic lung disease, solitary lung nodule and OSA s/p Inspire who presents for follow-up  Right upper lung nodule, 1cm --Intermediate risk of malignancy --SCHEDULE CT Chest in 6 months (June 2023) --If interval enlargement of lung nodule will consider PET/CT.  Shortness of breath Diffuse cystic lung disease Low suspicion for LAM, PLCH, BHD. LIP in differential --Neg serologic testing: HIV, antinuclear antibody, anti-Ro/SSA, anti-La/SSB, rheumatoid factor --ORDER pulmonary function test to rule out obstructive or restrictive lung disease  Health Maintenance Immunization History  Administered Date(s) Administered   Fluad Quad(high Dose 65+) 11/19/2019, 11/15/2020   H1N1 03/10/2008   Influenza Split 11/12/2014   Influenza, Quadrivalent, Recombinant, Inj, Pf 11/27/2017   Influenza,inj,Quad PF,6+ Mos 01/11/2016   Influenza,trivalent, recombinat, inj, PF 01/08/2017, 12/26/2017   Influenza-Unspecified 12/29/2001, 02/16/2004, 01/16/2005, 01/07/2006, 03/09/2007, 11/13/2007, 10/12/2008, 12/01/2009, 01/04/2011, 01/13/2012, 12/07/2012, 01/13/2015, 10/13/2018   Moderna Covid-19 Vaccine Bivalent Booster 17yrs & up 11/28/2020   Moderna Sars-Covid-2 Vaccination 03/18/2019, 04/15/2019, 12/16/2019, 05/22/2020   Pneumococcal Conjugate-13 09/02/2013   Pneumococcal Polysaccharide-23 08/28/2015   Pneumococcal-Unspecified 01/25/2002   Tdap 08/22/2011   Zoster Recombinat (Shingrix) 12/29/2018, 03/02/2019   Zoster, Live 12/09/2013   CT Lung Screen - not indicated. No significant tobacco history  Orders Placed This Encounter  Procedures   CT CHEST WO CONTRAST    Standing Status:   Future    Standing Expiration Date:    01/20/2022    Scheduling Instructions:     Schedule CT for June 2023. Please also ensure patient has appointment visit with me on same day or after with me to discuss CT results    Order Specific Question:   Preferred imaging location?    Answer:   [place] [facility]   Pulmonary function test    Standing Status:   Future    Standing Expiration Date:   01/15/2022    Order Specific Question:   Where should this test be performed?    Answer:   [facility]    Order Specific Question:   Full PFT: includes the following: basic spirometry, spirometry pre & post bronchodilator, diffusion capacity (DLCO), lung volumes    Answer:   Full PFT  No orders of the defined types were placed in this encounter.   Return in about 8 weeks (around 03/09/2021).  I have spent a total time of 35-minutes on the day of the appointment reviewing prior documentation, coordinating care and discussing medical diagnosis and plan with the patient/family. Past medical history, allergies, medications were reviewed. Pertinent imaging, labs and tests included in this note have been reviewed and interpreted independently by me.  Lars Jeziorski 03/11/2021,  MD [facility] 01/15/2021 9:24 AM  Office Number [phone]

## 2021-01-20 ENCOUNTER — Encounter: Payer: Self-pay | Admitting: Pulmonary Disease

## 2021-01-20 DIAGNOSIS — J984 Other disorders of lung: Secondary | ICD-10-CM | POA: Insufficient documentation

## 2021-02-16 ENCOUNTER — Telehealth: Payer: Self-pay | Admitting: Pulmonary Disease

## 2021-02-19 NOTE — Telephone Encounter (Signed)
OV notes printed and faxed. LM informing Otila Kluver.  Nothing further needed at this time.

## 2021-03-09 ENCOUNTER — Ambulatory Visit (INDEPENDENT_AMBULATORY_CARE_PROVIDER_SITE_OTHER): Payer: No Typology Code available for payment source | Admitting: Pulmonary Disease

## 2021-03-09 ENCOUNTER — Other Ambulatory Visit: Payer: Self-pay

## 2021-03-09 ENCOUNTER — Telehealth: Payer: Self-pay | Admitting: Pulmonary Disease

## 2021-03-09 ENCOUNTER — Encounter: Payer: Self-pay | Admitting: Pulmonary Disease

## 2021-03-09 VITALS — BP 138/90 | HR 68 | Ht 70.5 in | Wt 189.0 lb

## 2021-03-09 DIAGNOSIS — J4530 Mild persistent asthma, uncomplicated: Secondary | ICD-10-CM

## 2021-03-09 DIAGNOSIS — R911 Solitary pulmonary nodule: Secondary | ICD-10-CM

## 2021-03-09 LAB — PULMONARY FUNCTION TEST
DL/VA % pred: 132 %
DL/VA: 5.29 ml/min/mmHg/L
DLCO cor % pred: 90 %
DLCO cor: 22.99 ml/min/mmHg
DLCO unc % pred: 90 %
DLCO unc: 22.99 ml/min/mmHg
FEF 25-75 Post: 2.49 L/sec
FEF 25-75 Pre: 1.59 L/sec
FEF2575-%Change-Post: 56 %
FEF2575-%Pred-Post: 107 %
FEF2575-%Pred-Pre: 68 %
FEV1-%Change-Post: 13 %
FEV1-%Pred-Post: 90 %
FEV1-%Pred-Pre: 79 %
FEV1-Post: 2.54 L
FEV1-Pre: 2.23 L
FEV1FVC-%Change-Post: 5 %
FEV1FVC-%Pred-Pre: 97 %
FEV6-%Change-Post: 8 %
FEV6-%Pred-Post: 91 %
FEV6-%Pred-Pre: 84 %
FEV6-Post: 3.28 L
FEV6-Pre: 3.04 L
FEV6FVC-%Change-Post: 0 %
FEV6FVC-%Pred-Post: 105 %
FEV6FVC-%Pred-Pre: 105 %
FVC-%Change-Post: 8 %
FVC-%Pred-Post: 87 %
FVC-%Pred-Pre: 80 %
FVC-Post: 3.28 L
FVC-Pre: 3.04 L
Post FEV1/FVC ratio: 77 %
Post FEV6/FVC ratio: 100 %
Pre FEV1/FVC ratio: 73 %
Pre FEV6/FVC Ratio: 100 %
RV % pred: 96 %
RV: 2.43 L
TLC % pred: 85 %
TLC: 6.01 L

## 2021-03-09 MED ORDER — BUDESONIDE-FORMOTEROL FUMARATE 160-4.5 MCG/ACT IN AERO: 2.0000 | INHALATION_SPRAY | Freq: Two times a day (BID) | RESPIRATORY_TRACT | 11 refills | Status: DC

## 2021-03-09 NOTE — Patient Instructions (Addendum)
Asthma --START Symbicort 160-4.5 TWO puffs TWICE a day. This is your EVERYDAY inhaler --CONTINUE Albuterol AS NEEDED for shortness of breath or wheezing. This is RESCUE inhaler  Follow-up with me in 5 months. Will need to schedule CT Chest prior to visit (order previously placed on last visit)

## 2021-03-09 NOTE — Patient Instructions (Signed)
Full PFT performed today. °

## 2021-03-09 NOTE — Progress Notes (Signed)
Full PFT performed today. °

## 2021-03-09 NOTE — Telephone Encounter (Signed)
Dr. Everardo All, please see message about pt's Symbicort inhaler. This is not on [facility] and the [facility] inhaler is.

## 2021-03-09 NOTE — Progress Notes (Signed)
Subjective:   PATIENT ID: Dalton Kidd GENDER: male DOB: [birth year], MRN: [id number]   HPI  Chief Complaint  Patient presents with   Follow-up    Pft review    Reason for Visit: Follow-up  [name] is a 76 year old male never smoker with OSA s/p Inspire in 2018 who presents for follow-up  Synopsis: He was previously seen by Dr. Shelle Iron at the [facility]. Listed pulmonary related problems include asthma, allergic rhinitis and sleep apnea. He was referred to [facility] in 2022 for CT with incidental cystic lung disease and right upper lobe nodule measuring 85mm. He previously had PFTs in 04/2020.   At baseline he is living independently. Able to perform regular activity including household and yard work. Denies shortness of breath or wheezing. Occasional cough, non-productive. He reports history reflux and regurgitation with some meals and he is on an anti-reflux daily that has helped resolve.   01/15/21 He recently had the flu three weeks ago but has since recovered at home. Tested positive at the [place]. He reports that sometimes has difficulty getting enough breath to speak. It is brief <1s but he feels he has to quickly breath. No wheezing or coughing. No issues at night or when laying down. His activities are not limited.  Social History: [employer] - lived in [place] 3-5 months Tried cigarettes but never smoked. Never vaped. 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     buPROPion (WELLBUTRIN XL) 300 MG 24 hr tablet Take 300 mg by mouth daily.     carboxymethylcellulose (REFRESH PLUS) 0.5 % SOLN Place 1 drop into both eyes 3 (three) times daily as needed (for dry eyes).     Multiple Vitamin (MULTIVITAMIN WITH MINERALS) TABS tablet Take 1 tablet by mouth daily.     omeprazole (PRILOSEC) 40 MG capsule Take 40 mg by mouth daily.     rosuvastatin (CRESTOR) 40 MG tablet TAKE ONE-HALF TABLET BY MOUTH ONCE A DAY FOR CHOLESTEROL     loratadine (CLARITIN) 10 MG tablet Take 10 mg by mouth daily as needed. (Patient not taking: Reported on 03/09/2021)     meloxicam (MOBIC) 15 MG tablet Take 15 mg by mouth daily. (Patient not taking: Reported on 03/09/2021)     No facility-administered medications prior to visit.    Review of Systems  Constitutional:  Negative for chills, diaphoresis, fever, malaise/fatigue and weight loss.  HENT:  Negative for congestion.   Respiratory:  Positive for shortness of breath. Negative for cough, hemoptysis, sputum production and wheezing.   Cardiovascular:  Negative for chest pain, palpitations and leg swelling.    Objective:   Vitals:   03/09/21  1110  BP: 138/90  Pulse: 68  SpO2: 96%  Weight: 189 lb (85.7 kg)  Height: 5' 10.5" (1.791 m)   SpO2: 96 % O2 Device: None (Room air)   Physical Exam: General: Well-appearing, no acute distress HENT: [place], AT Eyes: EOMI, no scleral icterus Respiratory: Clear to auscultation bilaterally.  No crackles, wheezing or rales Cardiovascular: RRR, -M/R/G, no JVD Extremities:-Edema,-tenderness Neuro: AAO x4, CNII-XII grossly intact Psych: Normal mood, normal affect  Data Reviewed:  Imaging: CT Chest 09/28/20 - Right upper lobe nodule ~1cm with diffuse scattered thin walled cysts  throughout lungs bilaterally     CT Chest 01/09/21 - Unchanged cystic/bronchiectatic lesions. RUL nodule similar size ~1cm  PFT: 03/09/21 FVC 3.28 (87%) FEV1 2.54 (90%) Ratio 73  TLC 85% DLCO 90% Interpretation: Normal spirometry  Labs: CBC No results found for: WBC, RBC, HGB, HCT, PLT, MCV, MCH, MCHC, RDW, LYMPHSABS, MONOABS, EOSABS, BASOSABS     Assessment & Plan:   Discussion: 76 year old male never smoker with diffuse cystic lung disease, solitary lung nodule and OSA s/p Inspire who presents for follow-up  76 year old male never smoker with diffuse cystic lung disease, solitary lung nodule and OSA s/p Inspire who presents for follow-up. PFTs reviewed and normal.  Right upper lung nodule, 1cm --Intermediate risk of malignancy --SCHEDULE CT Chest in 6 months (June 2023) --If interval enlargement of lung nodule will consider PET/CT.  Asthma Diffuse cystic lung disease Low suspicion for LAM, PLCH, BHD. LIP in differential --Neg serologic testing: HIV, antinuclear antibody, anti-Ro/SSA, anti-La/SSB, rheumatoid factor --START Symbicort 160-4.5 TWO puffs TWICE a day. This is your EVERYDAY inhaler --CONTINUE Albuterol AS NEEDED for shortness of breath or wheezing. This is RESCUE inhaler  Addendum: [facility] does not approve of Symbicort. Will change to Wixela 250-50 ONE puff TWICE a day  Health Maintenance Immunization History  Administered Date(s) Administered   Fluad Quad(high Dose 65+) 11/19/2019, 11/15/2020   H1N1 03/10/2008   Influenza Split 11/12/2014   Influenza, Quadrivalent, Recombinant, Inj, Pf 11/27/2017   Influenza,inj,Quad PF,6+ Mos 01/11/2016   Influenza,trivalent, recombinat, inj, PF 01/08/2017, 12/26/2017   Influenza-Unspecified 12/29/2001, 02/16/2004, 01/16/2005, 01/07/2006, 03/09/2007, 11/13/2007, 10/12/2008, 12/01/2009, 01/04/2011, 01/13/2012, 12/07/2012, 01/13/2015, 10/13/2018   Moderna Covid-19 Vaccine Bivalent Booster 11yrs & up 11/28/2020   Moderna  Sars-Covid-2 Vaccination 03/18/2019, 04/15/2019, 12/16/2019, 05/22/2020   Pneumococcal Conjugate-13 09/02/2013   Pneumococcal Polysaccharide-23 08/28/2015   Pneumococcal-Unspecified 01/25/2002   Tdap 08/22/2011   Zoster Recombinat (Shingrix) 12/29/2018, 03/02/2019   Zoster, Live 12/09/2013   CT Lung Screen - not indicated. No significant tobacco history  No orders of the defined types were placed in this encounter.  Meds ordered this encounter  Medications   DISCONTD: budesonide-formoterol (SYMBICORT) 160-4.5 MCG/ACT inhaler    Sig: Inhale 2 puffs into the lungs in the morning and at bedtime.    Dispense:  1 each    Refill:  11   fluticasone-salmeterol (WIXELA INHUB) 250-50 MCG/ACT AEPB    Sig: Inhale 1 puff into the lungs in the morning and at bedtime.    Dispense:  60 each    Refill:  5    Return in about 5 months (around 08/07/2021).  I have spent a total time of 33-minutes on the day of the appointment reviewing prior documentation, coordinating care and discussing medical diagnosis and plan with the patient/family. Past medical history, allergies, medications were reviewed. Pertinent imaging, labs and tests included in this note have been reviewed and interpreted independently by me.  Marvelene Stoneberg Rodman Pickle, MD  [facility] 03/09/2021 11:27 AM  Office Number [phone]

## 2021-03-11 ENCOUNTER — Encounter: Payer: Self-pay | Admitting: Pulmonary Disease

## 2021-03-11 MED ORDER — FLUTICASONE-SALMETEROL 250-50 MCG/ACT IN AEPB: 1.0000 | INHALATION_SPRAY | Freq: Two times a day (BID) | RESPIRATORY_TRACT | 5 refills | Status: DC

## 2021-03-11 NOTE — Telephone Encounter (Signed)
Patient changed from Symbicort to Wixela 250-50 mcg ONE puff TWICE a day due to [place] approval.  Please contact patient regarding device change and how often to take as noted above.

## 2021-03-12 NOTE — Telephone Encounter (Signed)
Called patient but he did not answer. Left message for him to call back.  

## 2021-04-05 MED ORDER — FLUTICASONE-SALMETEROL 250-50 MCG/ACT IN AEPB: 1.0000 | INHALATION_SPRAY | Freq: Two times a day (BID) | RESPIRATORY_TRACT | 5 refills | Status: DC

## 2021-04-05 NOTE — Telephone Encounter (Signed)
Wixela was sent to local pharmacy. Called and spoke to pt. Pt states he has not been on any inhaler as [facility] and the [place] was sent to retail pharmacy. Wixela script has now been sent to [facility], spoke with [place] and confirmed this is correct. Called and spoke to pt. Informed him of the change of medication and frequency of which it should be taken. Also informed pt to rinse/wash mouth out after each use and to make sure the pharmacist shows pt how to use the inhaler. Pt aware to call back with any questions or concerns.

## 2021-04-11 DIAGNOSIS — Z20822 Contact with and (suspected) exposure to covid-19: Secondary | ICD-10-CM | POA: Diagnosis not present

## 2021-07-30 ENCOUNTER — Ambulatory Visit (INDEPENDENT_AMBULATORY_CARE_PROVIDER_SITE_OTHER)
Admission: RE | Admit: 2021-07-30 | Discharge: 2021-07-30 | Disposition: A | Discharge status: Home / Self Care | Payer: Medicare HMO | Source: Ambulatory Visit | Attending: Pulmonary Disease | Admitting: Pulmonary Disease

## 2021-07-30 DIAGNOSIS — R911 Solitary pulmonary nodule: Secondary | ICD-10-CM

## 2021-08-06 ENCOUNTER — Ambulatory Visit (INDEPENDENT_AMBULATORY_CARE_PROVIDER_SITE_OTHER): Payer: No Typology Code available for payment source | Admitting: Pulmonary Disease

## 2021-08-06 ENCOUNTER — Encounter: Payer: Self-pay | Admitting: Pulmonary Disease

## 2021-08-06 VITALS — BP 130/80 | HR 70 | Temp 98.2°F | Ht 70.5 in | Wt 190.8 lb

## 2021-08-06 DIAGNOSIS — J984 Other disorders of lung: Secondary | ICD-10-CM

## 2021-08-06 DIAGNOSIS — R911 Solitary pulmonary nodule: Secondary | ICD-10-CM | POA: Diagnosis not present

## 2021-08-06 DIAGNOSIS — J4530 Mild persistent asthma, uncomplicated: Secondary | ICD-10-CM | POA: Diagnosis not present

## 2021-08-06 MED ORDER — BUDESONIDE-FORMOTEROL FUMARATE 160-4.5 MCG/ACT IN AERO: 2.0000 | INHALATION_SPRAY | Freq: Two times a day (BID) | RESPIRATORY_TRACT | 5 refills | Status: DC

## 2021-08-07 ENCOUNTER — Encounter: Payer: Self-pay | Admitting: Pulmonary Disease

## 2021-08-08 ENCOUNTER — Other Ambulatory Visit: Payer: Self-pay | Admitting: Pulmonary Disease

## 2021-11-06 ENCOUNTER — Encounter: Payer: Self-pay | Admitting: Pulmonary Disease

## 2021-11-06 ENCOUNTER — Ambulatory Visit (INDEPENDENT_AMBULATORY_CARE_PROVIDER_SITE_OTHER): Payer: No Typology Code available for payment source | Admitting: Pulmonary Disease

## 2021-11-06 VITALS — BP 126/80 | HR 85 | Ht 70.5 in | Wt 190.4 lb

## 2021-11-06 DIAGNOSIS — J4531 Mild persistent asthma with (acute) exacerbation: Secondary | ICD-10-CM

## 2021-11-06 DIAGNOSIS — R490 Dysphonia: Secondary | ICD-10-CM | POA: Insufficient documentation

## 2021-11-06 MED ORDER — BUDESONIDE-FORMOTEROL FUMARATE 160-4.5 MCG/ACT IN AERO
2.0000 | INHALATION_SPRAY | Freq: Two times a day (BID) | RESPIRATORY_TRACT | 5 refills | Status: DC
Start: 2021-11-06 — End: 2022-01-21

## 2021-11-06 NOTE — Patient Instructions (Signed)
Asthma --CONTINUE Symbicort 160-4.5 mcg TWO puffs TWICE a day. Use with a spacer.  --CONTINUE Albuterol AS NEEDED for shortness of breath or wheezing. This is RESCUE inhaler  Hoarseness --REFER to ENT  Follow-up with me in 3 months

## 2021-11-06 NOTE — Progress Notes (Unsigned)
Subjective:   PATIENT ID: Dalton Kidd GENDER: male DOB: 13-Mar-1945, MRN: [id number]   HPI  Chief Complaint  Patient presents with   Follow-up    SOB    Reason for Visit: Follow-up  Dalton Kidd is a 76 year old male never smoker with OSA s/p Inspire in 2018 who presents for follow-up  Synopsis: He was previously seen by Dr. Shelle Iron at the [facility]. Listed pulmonary related problems include asthma, allergic rhinitis and sleep apnea. He was referred to [facility] in 2022 for CT with incidental cystic lung disease and right upper lobe nodule measuring 53mm. He previously had PFTs in 04/2020.   At baseline he is living independently. Able to perform regular activity including household and yard work. Denies shortness of breath or wheezing. Occasional cough, non-productive. He reports history reflux and regurgitation with some meals and he is on an anti-reflux daily that has helped resolve.   01/15/21 He recently had the flu three weeks ago but has since recovered at home. Tested positive at the [place]. He reports that sometimes has difficulty getting enough breath to speak. It is brief <1s but he feels he has to quickly breath. No wheezing or coughing. No issues at night or when laying down. His activities are not limited.  08/06/21 He reports hoarseness with Wixela. But maybe some improvement with his lungs. He is able to talk longer however now limited by hoarseness. Denies wheezing or coughing. No nocturnal symptoms.  11/06/21 Since our last visit he has been compliant with Symbicort with spacer. Has not noticed a change with his hoarseness. Reports shortness of breath is unchanged. He is not talking as much.  Social History: [employer] - lived in [place] 3-5 months Tried cigarettes but never smoked. Never vaped. 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     budesonide-formoterol (SYMBICORT) 160-4.5 MCG/ACT inhaler INHALE 2 PUFFS INTO THE LUNGS IN THE MORNING AND AT BEDTIME 10.2 g 4   buPROPion (WELLBUTRIN XL) 300 MG 24 hr tablet Take 300 mg by mouth daily.     carboxymethylcellulose (REFRESH PLUS) 0.5 % SOLN Place 1 drop into both eyes 3 (three) times daily as needed (for dry eyes).     fluticasone-salmeterol (WIXELA INHUB) 250-50 MCG/ACT AEPB Inhale 1 puff into the lungs in the morning and at bedtime. 60 each 5   loratadine (CLARITIN) 10 MG tablet Take 10 mg by mouth daily as needed.     meloxicam (MOBIC) 15 MG tablet Take 15 mg by mouth daily.     Multiple Vitamin (MULTIVITAMIN WITH MINERALS) TABS tablet Take 1 tablet by mouth daily.     omeprazole (PRILOSEC) 40 MG capsule  Take 40 mg by mouth daily.     rosuvastatin (CRESTOR) 40 MG tablet TAKE ONE-HALF TABLET BY MOUTH ONCE A DAY FOR CHOLESTEROL     No facility-administered medications prior to visit.    Review of Systems  Constitutional:  Negative for chills, diaphoresis, fever, malaise/fatigue and weight loss.  HENT:  Negative for congestion.        Hoarseness  Respiratory:  Negative for cough, hemoptysis, sputum production, shortness of breath and wheezing.   Cardiovascular:  Negative for chest pain, palpitations and leg swelling.     Objective:   Vitals:   11/06/21 1036  BP: 126/80   Pulse: 85  SpO2: 100%  Weight: 190 lb 6.4 oz (86.4 kg)  Height: 5' 10.5" (1.791 m)   SpO2: 100 % O2 Device: None (Room air) Physical Exam: General: Well-appearing, no acute distress HENT: [place], AT Eyes: EOMI, no scleral icterus Respiratory: Clear to auscultation bilaterally.  No crackles, wheezing or rales Cardiovascular: RRR, -M/R/G, no JVD Extremities:-Edema,-tenderness Neuro: AAO x4, CNII-XII grossly intact Psych: Normal mood, normal affect   Data Reviewed:  Imaging: CT Chest 09/28/20 - Right upper lobe nodule ~1cm with diffuse scattered thin walled cysts throughout lungs bilaterally     CT Chest 01/09/21 - Unchanged cystic/bronchiectatic lesions. RUL nodule similar size ~1cm  CT Chest 07/30/21 - Interval improved in right upper lobe nodules. Similar scattered blebs and bullae in both lungs.   PFT: 03/09/21 FVC 3.28 (87%) FEV1 2.54 (90%) Ratio 73  TLC 85% DLCO 90% Interpretation: Normal spirometry  Labs: CBC No results found for: "WBC", "RBC", "HGB", "HCT", "PLT", "MCV", "MCH", "MCHC", "RDW", "LYMPHSABS", "MONOABS", "EOSABS", "BASOSABS"     Assessment & Plan:   Discussion: 76 year old male never smoker with diffuse cystic lung disease, solitary lung nodule and OSA s/p Inspire who presents for follow-up. Reviewed CT imaging.  Consider de-escalating LAMA/LABA option in the future. Can order sooner if needed. Will need ENT evaluation   Right upper lung nodules - resolved. Likely inflammatory/infectious process that improved without further intervention --No further imaging indicated  Mild persistent asthma --CONTINUE Symbicort 160-4.5 mcg TWO puffs TWICE a day. Use with a spacer.  --CONTINUE Albuterol AS NEEDED for shortness of breath or wheezing. This is RESCUE inhaler  Hoarseness --REFER to ENT  Diffuse cystic lung disease Low suspicion for LAM, PLCH, BHD. LIP in differential --Neg serologic testing: HIV, antinuclear antibody, anti-Ro/SSA, anti-La/SSB,  rheumatoid factor  Health Maintenance Immunization History  Administered Date(s) Administered   Fluad Quad(high Dose 65+) 11/19/2019, 11/15/2020   H1N1 03/10/2008   Influenza Split 11/12/2014   Influenza, Quadrivalent, Recombinant, Inj, Pf 11/27/2017   Influenza,inj,Quad PF,6+ Mos 01/11/2016   Influenza,trivalent, recombinat, inj, PF 01/08/2017, 12/26/2017   Influenza-Unspecified 12/29/2001, 02/16/2004, 01/16/2005, 01/07/2006, 03/09/2007, 11/13/2007, 10/12/2008, 12/01/2009, 01/04/2011, 01/13/2012, 12/07/2012, 01/13/2015, 10/13/2018   Moderna Covid-19 Vaccine Bivalent Booster 69yrs & up 11/28/2020   Moderna Sars-Covid-2 Vaccination 03/18/2019, 04/15/2019, 12/16/2019, 05/22/2020   Pneumococcal Conjugate-13 09/02/2013   Pneumococcal Polysaccharide-23 08/28/2015   Pneumococcal-Unspecified 01/25/2002   Tdap 08/22/2011   Zoster Recombinat (Shingrix) 12/29/2018, 03/02/2019   Zoster, Live 12/09/2013   CT Lung Screen - not indicated. No significant tobacco history  No orders of the defined types were placed in this encounter.  No orders of the defined types were placed in this encounter.   No follow-ups on file.  I have spent a total time of 31-minutes on the day of the appointment including chart review, data review, collecting history, coordinating care and discussing medical diagnosis  and plan with the patient/family. Past medical history, allergies, medications were reviewed. Pertinent imaging, labs and tests included in this note have been reviewed and interpreted independently by me.  Kharlie Bring Mechele Collin, MD [facility] 11/06/2021 10:44 AM  Office Number [phone]

## 2021-11-08 ENCOUNTER — Encounter: Payer: Self-pay | Admitting: Pulmonary Disease

## 2022-01-21 ENCOUNTER — Encounter (HOSPITAL_BASED_OUTPATIENT_CLINIC_OR_DEPARTMENT_OTHER): Payer: Self-pay | Admitting: Pulmonary Disease

## 2022-01-21 ENCOUNTER — Ambulatory Visit (INDEPENDENT_AMBULATORY_CARE_PROVIDER_SITE_OTHER): Payer: No Typology Code available for payment source | Admitting: Pulmonary Disease

## 2022-01-21 VITALS — BP 140/80 | HR 78 | Ht 70.5 in | Wt 191.7 lb

## 2022-01-21 DIAGNOSIS — R490 Dysphonia: Secondary | ICD-10-CM | POA: Diagnosis not present

## 2022-01-21 DIAGNOSIS — R0602 Shortness of breath: Secondary | ICD-10-CM

## 2022-01-21 MED ORDER — STIOLTO RESPIMAT 2.5-2.5 MCG/ACT IN AERS: 2.0000 | INHALATION_SPRAY | Freq: Every day | RESPIRATORY_TRACT | 5 refills | Status: DC

## 2022-01-21 NOTE — Progress Notes (Signed)
Subjective:   PATIENT ID: Dalton Kidd GENDER: male DOB: [birth year], MRN: [id number]   HPI  Chief Complaint  Patient presents with   Follow-up    Patient unaware he has asthma   Reason for Visit: Follow-up  [name] is a 76 year old male never smoker with OSA s/p Inspire in 2018 who presents for follow-up  Synopsis: He was previously seen by Dr. Shelle Iron at the [facility]. Listed pulmonary related problems include asthma, allergic rhinitis and sleep apnea. He was referred to [facility] in 2022 for CT with incidental cystic lung disease and right upper lobe nodule measuring 19mm. He previously had PFTs in 04/2020.   At baseline he is living independently. Able to perform regular activity including household and yard work. Denies shortness of breath or wheezing. Occasional cough, non-productive. He reports history reflux and regurgitation with some meals and he is on an anti-reflux daily that has helped resolve.   01/15/21 He recently had the flu three weeks ago but has since recovered at home. Tested positive at the [place]. He reports that sometimes has difficulty getting enough breath to speak. It is brief <1s but he feels he has to quickly breath. No wheezing or coughing. No issues at night or when laying down. His activities are not limited.  08/06/21 He reports hoarseness with Wixela. But maybe some improvement with his lungs. He is able to talk longer however now limited by hoarseness. Denies wheezing or coughing. No nocturnal symptoms.  11/06/21 Since our last visit he has been compliant with Symbicort with spacer. Has not noticed a change with his hoarseness. Reports shortness of breath is unchanged. He is not talking as much or as active. No exacerbations since our last visit. Denies wheezing or coughing. No nocturnal symptoms.  01/21/22 Since our last visit he was seen by ENT and reports negative work-up for his hoarseness. He takes medications of reflux but no  signs of this on exam.Has not had any benefit recently with Symbicort and while talking his voice stops. Denies wheezing or coughing.  Social History: [employer] - lived in [place] 3-5 months Tried cigarettes but never smoked. Never vaped. 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     budesonide-formoterol (SYMBICORT) 160-4.5 MCG/ACT inhaler Inhale 2 puffs into the lungs in the morning and at bedtime. 10.2 g 5   buPROPion (WELLBUTRIN XL) 300 MG 24 hr tablet Take 300 mg by mouth daily.     carboxymethylcellulose (REFRESH PLUS) 0.5 % SOLN Place 1 drop into both eyes 3 (three) times daily as needed (for dry eyes).     loratadine (CLARITIN) 10 MG tablet Take 10 mg by mouth daily  as needed.     meloxicam (MOBIC) 15 MG tablet Take 15 mg by mouth daily.     Multiple Vitamin (MULTIVITAMIN WITH MINERALS) TABS tablet Take 1 tablet by mouth daily.     omeprazole (PRILOSEC) 40 MG capsule Take 40 mg by mouth daily.     rosuvastatin (CRESTOR) 40 MG tablet TAKE ONE-HALF TABLET BY MOUTH ONCE A DAY FOR CHOLESTEROL     No facility-administered medications prior to visit.    Review of Systems  Constitutional:  Negative for chills, diaphoresis, fever, malaise/fatigue and weight loss.  HENT:  Negative for  congestion.        Hoarseness  Respiratory:  Negative for cough, hemoptysis, sputum production, shortness of breath and wheezing.   Cardiovascular:  Negative for chest pain, palpitations and leg swelling.     Objective:   Vitals:   01/21/22 1510  BP: (!) 140/80  Pulse: 78  SpO2: 99%  Weight: 191 lb 11 oz (87 kg)  Height: 5' 10.5" (1.791 m)   SpO2: 99 % O2 Device: None (Room air)  Physical Exam: General: Well-appearing, no acute distress HENT: [place], AT Eyes: EOMI, no scleral icterus Respiratory: Clear to auscultation bilaterally.  No crackles, wheezing or rales Cardiovascular: RRR, -M/R/G, no JVD Extremities:-Edema,-tenderness Neuro: AAO x4, CNII-XII grossly intact Psych: Normal mood, normal affect  Data Reviewed:  Imaging: CT Chest 09/28/20 - Right upper lobe nodule ~1cm with diffuse scattered thin walled cysts throughout lungs bilaterally     CT Chest 01/09/21 - Unchanged cystic/bronchiectatic lesions. RUL nodule similar size ~1cm  CT Chest 07/30/21 - Interval improved in right upper lobe nodules. Similar scattered blebs and bullae in both lungs.   PFT: 03/09/21 FVC 3.28 (87%) FEV1 2.54 (90%) Ratio 73  TLC 85% DLCO 90% Interpretation: Normal spirometry  Labs: CBC No results found for: "WBC", "RBC", "HGB", "HCT", "PLT", "MCV", "MCH", "MCHC", "RDW", "LYMPHSABS", "MONOABS", "EOSABS", "BASOSABS"     Assessment & Plan:   Discussion: 76 year old male never smoker with diffuse cystic lung disease, solitary nodule and OSA s/p Inspire who presents for follow-up. Continues to have hoarseness. Will trial off ICS/LABA before starting LAMA/LABA  Mild persistent asthma/shortness of breath --STOP Symbicort due to hoarseness --After stopping inhalers for one month, start taking Stiolto 2.5/2.5 mcg TWO puffs ONCE a day --CONTINUE Albuterol AS NEEDED for shortness of breath or wheezing. This is RESCUE inhaler  Hoarseness ENT eval neg. May be related to ICS inhaler. --STOP  Symbicort  Diffuse cystic lung disease Low suspicion for LAM, PLCH, BHD. LIP in differential --Neg serologic testing: HIV, antinuclear antibody, anti-Ro/SSA, anti-La/SSB, rheumatoid factor  Health Maintenance Immunization History  Administered Date(s) Administered   Covid-19, Mrna,Vaccine(Spikevax)44yrs and older 01/10/2022   Fluad Quad(high Dose 65+) 11/19/2019, 11/15/2020   H1N1 03/10/2008   Influenza Split 11/12/2014   Influenza, High Dose Seasonal PF 11/21/2021   Influenza, Quadrivalent, Recombinant, Inj, Pf 11/27/2017   Influenza,inj,Quad PF,6+ Mos 01/11/2016   Influenza,trivalent, recombinat, inj, PF 01/08/2017, 12/26/2017   Influenza-Unspecified 12/29/2001, 02/16/2004, 01/16/2005, 01/07/2006, 03/09/2007, 11/13/2007, 10/12/2008, 12/01/2009, 01/04/2011, 01/13/2012, 12/07/2012, 01/13/2015, 10/13/2018   Moderna Covid-19 Vaccine Bivalent Booster 46yrs & up 11/28/2020   Moderna Sars-Covid-2 Vaccination 03/18/2019, 04/15/2019, 12/16/2019, 05/22/2020   Pneumococcal Conjugate-13 09/02/2013   Pneumococcal Polysaccharide-23 08/28/2015   Pneumococcal-Unspecified 01/25/2002   Tdap 08/22/2011   Zoster Recombinat (Shingrix) 12/29/2018, 03/02/2019   Zoster, Live 12/09/2013   CT Lung Screen - not indicated. No significant tobacco history  No orders of the defined types were placed in this encounter.  Meds ordered this encounter  Medications   Tiotropium Bromide-Olodaterol (STIOLTO RESPIMAT) 2.5-2.5 MCG/ACT AERS    Sig: Inhale 2 puffs into the lungs daily.    Dispense:  4 g    Refill:  5    Return in about 4 months (around 05/23/2022).  I have spent a total time of 33-minutes on the day of the appointment including chart review, data review, collecting history, coordinating care and discussing medical diagnosis and plan with the patient/family. Past medical history, allergies, medications were reviewed. Pertinent imaging, labs and tests included in this note have been reviewed and  interpreted independently by me.  Raisa Ditto Mechele Collin, MD [facility] Office Number [phone]

## 2022-01-21 NOTE — Patient Instructions (Addendum)
Mild persistent asthma/shortness of breath --STOP Symbicort due to hoarseness --After stopping inhalers for one month, start taking Stiolto 2.5/2.5 mcg TWO puffs ONCE a day --CONTINUE Albuterol AS NEEDED for shortness of breath or wheezing. This is RESCUE inhaler  Hoarseness May be related to ICS inhaler but will rule out structural abnormality --STOP Symbicort  Follow-up with me in 4 months (April)

## 2022-01-27 ENCOUNTER — Encounter (HOSPITAL_BASED_OUTPATIENT_CLINIC_OR_DEPARTMENT_OTHER): Payer: Self-pay | Admitting: Pulmonary Disease

## 2022-04-16 ENCOUNTER — Encounter: Payer: Self-pay | Admitting: Gastroenterology

## 2022-05-30 ENCOUNTER — Ambulatory Visit (HOSPITAL_BASED_OUTPATIENT_CLINIC_OR_DEPARTMENT_OTHER): Payer: No Typology Code available for payment source | Admitting: Pulmonary Disease

## 2022-06-07 ENCOUNTER — Encounter (HOSPITAL_BASED_OUTPATIENT_CLINIC_OR_DEPARTMENT_OTHER): Payer: Self-pay | Admitting: Pulmonary Disease

## 2022-06-07 ENCOUNTER — Ambulatory Visit (HOSPITAL_BASED_OUTPATIENT_CLINIC_OR_DEPARTMENT_OTHER): Payer: No Typology Code available for payment source | Admitting: Pulmonary Disease

## 2022-06-07 VITALS — BP 116/84 | HR 68 | Temp 97.9°F | Ht 70.5 in | Wt 194.4 lb

## 2022-06-07 DIAGNOSIS — J4530 Mild persistent asthma, uncomplicated: Secondary | ICD-10-CM

## 2022-06-07 DIAGNOSIS — R490 Dysphonia: Secondary | ICD-10-CM

## 2022-06-07 NOTE — Progress Notes (Signed)
Subjective:   PATIENT ID: Dalton Kidd GENDER: male DOB: 05/09/1945, MRN: [id number]   HPI  Chief Complaint  Patient presents with   Follow-up    Follow up. Patient stated he stopped taking his inhalers because they're messing with his voice.    Reason for Visit: Follow-up  [name] is a 77 year old male never smoker with OSA s/p Inspire in 2018 who presents for follow-up  Synopsis: He was previously seen by Dr. Shelle Iron at the [facility]. Listed pulmonary related problems include asthma, allergic rhinitis and sleep apnea. He was referred to [facility] in 2022 for CT with incidental cystic lung disease and right upper lobe nodule measuring 10mm. He previously had PFTs in 04/2020.   At baseline he is living independently. Able to perform regular activity including household and yard work. Denies shortness of breath or wheezing. Occasional cough, non-productive. He reports history reflux and regurgitation with some meals and he is on an anti-reflux daily that has helped resolve.   01/15/21 He recently had the flu three weeks ago but has since recovered at home. Tested positive at the [place]. He reports that sometimes has difficulty getting enough breath to speak. It is brief <1s but he feels he has to quickly breath. No wheezing or coughing. No issues at night or when laying down. His activities are not limited.  08/06/21 He reports hoarseness with [place]. But maybe some improvement with his lungs. He is able to talk longer however now limited by hoarseness. Denies wheezing or coughing. No nocturnal symptoms.  11/06/21 Since our last visit he has been compliant with Symbicort with spacer. Has not noticed a change with his hoarseness. Reports shortness of breath is unchanged. He is not talking as much or as active. No exacerbations since our last visit. Denies wheezing or coughing. No nocturnal symptoms.  01/21/22 Since our last visit he was seen by ENT and reports negative  work-up for his hoarseness. He takes medications of reflux but no signs of this on exam.Has not had any benefit recently with Symbicort and while talking his voice stops. Denies wheezing or coughing.  06/07/22 He is no longer taking any inhalers. He tried [employer] for a month but still felt hoarse. He has been off inhalers for over three months and still has unchanged hoarseness. Denies wheezing or cough.   Social History: [employer] - lived in [place] 3-5 months Tried cigarettes but never smoked. Never vaped. 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     buPROPion (WELLBUTRIN XL) 300 MG 24 hr tablet Take 300 mg by mouth daily.     carboxymethylcellulose (REFRESH PLUS) 0.5 % SOLN Place 1 drop  into both eyes 3 (three) times daily as needed (for dry eyes).     loratadine (CLARITIN) 10 MG tablet Take 10 mg by mouth daily as needed.     meloxicam (MOBIC) 15 MG tablet Take 15 mg by mouth daily.     Multiple Vitamin (MULTIVITAMIN WITH MINERALS) TABS tablet Take 1 tablet by mouth daily.     omeprazole (PRILOSEC) 40 MG capsule Take 40 mg by mouth daily.     Tiotropium Bromide-Olodaterol (STIOLTO RESPIMAT) 2.5-2.5 MCG/ACT AERS Inhale 2 puffs into the lungs daily. 4 g 5   rosuvastatin (CRESTOR) 40 MG  tablet TAKE ONE-HALF TABLET BY MOUTH ONCE A DAY FOR CHOLESTEROL     No facility-administered medications prior to visit.    Review of Systems  Constitutional:  Negative for chills, diaphoresis, fever, malaise/fatigue and weight loss.  HENT:  Negative for congestion.   Respiratory:  Negative for cough, hemoptysis, sputum production, shortness of breath and wheezing.        Hoarseness  Cardiovascular:  Negative for chest pain, palpitations and leg swelling.     Objective:   Vitals:   06/07/22 0825  BP: 116/84  Pulse: 68  Temp: 97.9 F (36.6 C)  TempSrc: Oral  SpO2: 100%  Weight: 194 lb 6.4 oz (88.2 kg)  Height: 5' 10.5" (1.791 m)   SpO2: 100 % O2 Device: None (Room air)  Physical Exam: General: Well-appearing, no acute distress HENT: [place], AT Eyes: EOMI, no scleral icterus Respiratory: Clear to auscultation bilaterally.  No crackles, wheezing or rales Cardiovascular: RRR, -M/R/G, no JVD Extremities:-Edema,-tenderness Neuro: AAO x4, CNII-XII grossly intact Psych: Normal mood, normal affect  Data Reviewed:  Imaging: CT Chest 09/28/20 - Right upper lobe nodule ~1cm with diffuse scattered thin walled cysts throughout lungs bilaterally     CT Chest 01/09/21 - Unchanged cystic/bronchiectatic lesions. RUL nodule similar size ~1cm  CT Chest 07/30/21 - Interval improved in right upper lobe nodules. Similar scattered blebs and bullae in both lungs.   PFT: 03/09/21 FVC 3.28 (87%) FEV1 2.54 (90%) Ratio 73  TLC 85% DLCO 90% Interpretation: Normal spirometry  Labs: CBC No results found for: "WBC", "RBC", "HGB", "HCT", "PLT", "MCV", "MCH", "MCHC", "RDW", "LYMPHSABS", "MONOABS", "EOSABS", "BASOSABS"     Assessment & Plan:   Discussion: 77 year old male never smoker with diffuse cystic lung disease, solitary nodule and OSA s/p Inspire who presents for follow-up. Hoarseness persists after being off of inhalers >3 months. Fortunately asthma is stable off maintenance inhalers.  Discussed other etiology of hoarseness including vocal cord abnormality or reflux. Prior ENT evaluation with negative evaluation. Patient considering second opinion. Also discussed PCP or GI evaluation to optimize reflux management.  Mild persistent asthma/shortness of breath --CONTINUE Albuterol AS NEEDED for shortness of breath or wheezing. This is RESCUE inhaler  Asthma Action Plan Use albuterol 2 puffs 3-4 times a day for worsening shortness of breath, wheezing and cough. If you symptoms do not improve in 24-48 hours, please our office for evaluation and/or prednisone taper.  Hoarseness ENT eval neg 01/2022. Has been off inhalers since 02/2022 with no improvement in hoarseness --Recommend re-evaluation or second opinion with ENT. Previously seen by:  Susy Frizzle, MD  [address]  [address]  [place], [place] [address]  [phone] (Work)  [phone] (Fax)  --You will need a [facility]erral by [facility] for [facility]ization so please contact your PCP --Consider GI referral for reflux management  Diffuse cystic lung disease Low suspicion for LAM, PLCH, BHD. LIP in  differential --Neg serologic testing: HIV, antinuclear antibody, anti-Ro/SSA, anti-La/SSB, rheumatoid factor  Health Maintenance Immunization History  Administered Date(s) Administered   Covid-19, Mrna,Vaccine(Spikevax)54yrs and older 01/10/2022   Fluad Quad(high Dose 65+) 11/19/2019, 11/15/2020   H1N1 03/10/2008   Influenza Split 11/12/2014   Influenza, High Dose Seasonal PF 11/21/2021   Influenza, Quadrivalent, Recombinant, Inj, Pf 11/27/2017   Influenza,inj,Quad PF,6+ Mos 01/11/2016   Influenza,trivalent, recombinat, inj, PF 01/08/2017, 12/26/2017   Influenza-Unspecified 12/29/2001, 02/16/2004, 01/16/2005, 01/07/2006, 03/09/2007, 11/13/2007, 10/12/2008, 12/01/2009, 01/04/2011, 01/13/2012, 12/07/2012, 01/13/2015, 10/13/2018   Moderna Covid-19 Vaccine Bivalent Booster 13yrs & up 11/28/2020   Moderna Sars-Covid-2  Vaccination 03/18/2019, 04/15/2019, 12/16/2019, 05/22/2020   Pneumococcal Conjugate-13 09/02/2013   Pneumococcal Polysaccharide-23 08/28/2015   Pneumococcal-Unspecified 01/25/2002   Rsv, Bivalent, Protein Subunit Rsvpref,pf Verdis Frederickson) 01/24/2022   Tdap 08/22/2011   Zoster Recombinat (Shingrix) 12/29/2018, 03/02/2019   Zoster, Live 12/09/2013   CT Lung Screen - not indicated. No significant tobacco history  No orders of the defined types were placed in this encounter.  No orders of the defined types were placed in this encounter.   Return if symptoms worsen or fail to improve.  I have spent a total time of 32-minutes on the day of the appointment including chart review, data review, collecting history, coordinating care and discussing medical diagnosis and plan with the patient/family. Past medical history, allergies, medications were reviewed. Pertinent imaging, labs and tests included in this note have been reviewed and interpreted independently by me.  Damar Petit Mechele Collin, MD [facility] Office Number [phone]

## 2022-06-07 NOTE — Patient Instructions (Signed)
Mild persistent asthma/shortness of breath --CONTINUE Albuterol AS NEEDED for shortness of breath or wheezing. This is RESCUE inhaler  Asthma Action Plan Use albuterol 2 puffs 3-4 times a day for worsening shortness of breath, wheezing and cough. If you symptoms do not improve in 24-48 hours, please our office for evaluation and/or prednisone taper.  Hoarseness ENT eval neg 01/2022. Has been off inhalers since 02/2022 with no improvement in hoarseness --Recommend re-evaluation or second opinion with ENT. Previously seen by:  Susy Frizzle, MD  [address]  [address]  [place], [place] [address]  [phone] (Work)  [phone] (Fax)  --You will need a re-referral by [facility] for re-authorization so please contact your PCP --Consider GI referral for reflux management

## 2022-06-13 ENCOUNTER — Encounter: Payer: Self-pay | Admitting: Gastroenterology

## 2022-06-13 ENCOUNTER — Ambulatory Visit (INDEPENDENT_AMBULATORY_CARE_PROVIDER_SITE_OTHER): Payer: Medicare HMO | Admitting: Gastroenterology

## 2022-06-13 VITALS — BP 124/86 | HR 72 | Ht 69.0 in | Wt 194.0 lb

## 2022-06-13 DIAGNOSIS — Z800 Family history of malignant neoplasm of digestive organs: Secondary | ICD-10-CM

## 2022-06-13 DIAGNOSIS — R490 Dysphonia: Secondary | ICD-10-CM

## 2022-06-13 DIAGNOSIS — K219 Gastro-esophageal reflux disease without esophagitis: Secondary | ICD-10-CM

## 2022-06-13 MED ORDER — PEG-KCL-NACL-NASULF-NA ASC-C 100 G PO SOLR: 1.0000 | Freq: Once | ORAL | 0 refills | Status: AC

## 2022-06-13 NOTE — Patient Instructions (Addendum)
_______________________________________________________  If your blood pressure at your visit was 140/90 or greater, please contact your primary care physician to follow up on this.  _______________________________________________________  If you are age 76 or older, your body mass index should be between 23-30. Your Body mass index is 28.65 kg/m. If this is out of the aforementioned range listed, please consider follow up with your Primary Care Provider.  If you are age 23 or younger, your body mass index should be between 19-25. Your Body mass index is 28.65 kg/m. If this is out of the aformentioned range listed, please consider follow up with your Primary Care Provider.   You have been scheduled for a colonoscopy. Please follow written instructions given to you at your visit today.  Please pick up your prep supplies at the pharmacy within the next 1-3 days. If you use inhalers (even only as needed), please bring them with you on the day of your procedure.  Increase Omeprazole twice daily . If there is no improvement in hoarseness after four weeks resume daily dosing.  ________________________________________________________  The [facility] providers would like to encourage you to use [facility] to communicate with providers for non-urgent requests or questions.  Due to long hold times on the telephone, sending your provider a message by [facility] may be a faster and more efficient way to get a response.  Please allow 48 business hours for a response.  Please remember that this is for non-urgent requests.   It was a pleasure to see you today!  Thank you for trusting me with your gastrointestinal care!    Scott E.Tomasa Rand, MD

## 2022-06-13 NOTE — Progress Notes (Signed)
HPI : Dalton Kidd is a very pleasant 77 year old male with a history of depression, sleep apnea and hyperlipidemia who is referred to [place] by the [facility] for consideration of repeat colonoscopy.  The patient's brother was diagnosed with colon cancer at age 91.  He has undergone multiple colonoscopies, most recently in 2019.  Have the procedure report, but an office visit from the [facility] indicates that the colonoscopy in 2019 was unremarkable and he was recommended to repeat in 5 years.  He does not think that he has ever had polyps on any previous colonoscopies. He denies any chronic lower GI symptoms.  He has regular bowel movements with formed brown stool, and denies problems with constipation, diarrhea or blood in his stool.  The patient has a history of GERD, with his primary symptom being acid regurgitation.  He has been taking omeprazole for over a year now, and this has completely resolved his regurgitation.  He recalls taking a different medication before omeprazole which did not work for him.  He denies having any problems with heartburn before or after starting omeprazole. He is bothered by chronic hoarseness.  This has been a problem for many months, if not longer.  He relates his hoarseness to when he started taking an inhaler for asthma.  He stopped taking inhalers 2 months ago, but has not seen improvement in his hoarseness.  He was evaluated by ENT in December and no abnormalities of his vocal cords were noted. He has hoarseness most every day, but it seems to be worse in the mornings. He denies any other GERD symptoms such as acid regurgitation, heartburn, globus sensation, cough or excessive phlegm.  No dysphagia.  He does not think that his hoarseness varies with the foods that he eats.  He denies waking up from sleep with any reflux  symptoms.   -------------------------------------------------------------------------------------------------------------------------  Previous Endoscopy: EGD:  DATE OF NOTE: May 06, 2017 Indication for Exam GERD.  Findings slight eso ring, dilated 20 mm , minimal effect HH  Endoscopic Diagnosis slight eso ring, dilated 20 mm , minimal effect HH  Recommendations stay on PPI  --------------------------------------------------------------  Colonoscopy:  DATE OF NOTE: May 06, 2017 Indication for Exam Family history of colon cancer.  Findings normal colon   Endoscopic Diagnosis normal colon  Recommendations 5 year follow up ( + FH )     Past Medical History:  Diagnosis Date   Depression    Hyperlipidemia    Sleep apnea      Past Surgical History:  Procedure Laterality Date   ANKLE FRACTURE SURGERY     Family History  Problem Relation Age of Onset   Diabetes Mother    Cancer Brother    Social History   Tobacco Use   Smoking status: Never   Smokeless tobacco: Never  Substance Use Topics   Alcohol use: No   Drug use: No   Current Outpatient Medications  Medication Sig Dispense Refill   Artificial Tear Ointment (ARTIFICIAL TEARS) ointment Place 1 application into both eyes at bedtime.     buPROPion (WELLBUTRIN XL) 300 MG 24 hr tablet Take 300 mg by mouth daily.     carboxymethylcellulose (REFRESH PLUS) 0.5 % SOLN Place 1 drop into both eyes 3 (three) times daily as needed (for dry eyes).     loratadine (CLARITIN) 10 MG tablet Take 10 mg by mouth daily as needed.     meloxicam (MOBIC) 15 MG tablet Take 15 mg by mouth daily.  Multiple Vitamin (MULTIVITAMIN WITH MINERALS) TABS tablet Take 1 tablet by mouth daily.     omeprazole (PRILOSEC) 40 MG capsule Take 40 mg by mouth daily.     rosuvastatin (CRESTOR) 40 MG tablet TAKE ONE-HALF TABLET BY MOUTH ONCE A DAY FOR CHOLESTEROL     Tiotropium Bromide-Olodaterol (STIOLTO RESPIMAT) 2.5-2.5 MCG/ACT  AERS Inhale 2 puffs into the lungs daily. 4 g 5   No current facility-administered medications for this visit.   No Known Allergies   Review of Systems: All systems reviewed and negative except where noted in HPI.    No results found.  Physical Exam: BP 124/86 (BP Location: Left Arm, Patient Position: Sitting, Cuff Size: Normal)   Pulse 72   Ht 5\' 9"  (1.753 m) Comment: height measured without shoes  Wt 194 lb (88 kg)   BMI 28.65 kg/m  Constitutional: Pleasant,well-developed, African-American male in no acute distress. HEENT: Normocephalic and atraumatic. Conjunctivae are normal. No scleral icterus. Neck supple.  Cardiovascular: Normal rate, regular rhythm.  Pulmonary/chest: Effort normal and breath sounds normal. No wheezing, rales or rhonchi. Abdominal: Soft, nondistended, nontender. Bowel sounds active throughout. There are no masses palpable. No hepatomegaly. Extremities: no edema Lymphadenopathy: No cervical adenopathy noted. Neurological: Alert and oriented to person place and time. Skin: Skin is warm and dry. No rashes noted. Psychiatric: Normal mood and affect. Behavior is normal.  CBC No results found for: "WBC", "RBC", "HGB", "HCT", "PLT", "MCV", "MCH", "MCHC", "RDW", "LYMPHSABS", "MONOABS", "EOSABS", "BASOSABS"  CMP  No results found for: "NA", "K", "CL", "CO2", "GLUCOSE", "BUN", "CREATININE", "CALCIUM", "PROT", "ALBUMIN", "AST", "ALT", "ALKPHOS", "BILITOT", "GFRNONAA", "GFRAA"   ASSESSMENT AND PLAN: 77 year old male with family history of colon cancer (brother, 54) with no reported personal history of polyps.  I do not have access to his previous colonoscopy records, other than secondhand reports that his last colonoscopy in 2019 was unremarkable.  He does not have any concerning lower GI symptoms.  Although I feel his risk of colon cancer is very low given his age and reported absence of previous polyps, it would be reasonable to perform one last colonoscopy given  his otherwise good health status. Regarding his hoarseness, it is possible that this is related to nonacid reflux, but it is interesting that his primary symptom of GERD was regurgitation (not heartburn), and that this symptom resolved completely with omeprazole.  He does not have any other symptoms of GERD currently. Because of his persistent bothersome symptoms with unclear etiology, I do think a repeat upper endoscopy to assess for any objective evidence of GERD is reasonable.  We discussed how a normal EGD does not rule out GERD as a cause of his hoarseness, and that this may require other studies, namely pH impedance testing.  If GERD is identified as the cause of his hoarseness, then there is not much else to do for it other than continue to make dietary/behavioral modifications and maximize PPI therapy (patient does not think he would be interested in a fundoplication). In the meantime, I recommended he go ahead and increase his omeprazole to twice daily   Family history colon cancer - Colonoscopy  Hoarseness/suspected GERD - EGD -Increase omeprazole to twice daily - GERD handout provided  The details, risks (including bleeding, perforation, infection, missed lesions, medication reactions and possible hospitalization or surgery if complications occur), benefits, and alternatives to EGD/colonoscopy with possible biopsy and possible polypectomy were discussed with the patient and he consents to proceed.   Marceline Napierala E. Tomasa Rand, MD [facility]  CC:  Clinic, [facility]

## 2022-06-25 ENCOUNTER — Encounter: Payer: Self-pay | Admitting: Gastroenterology

## 2022-07-04 ENCOUNTER — Ambulatory Visit (INDEPENDENT_AMBULATORY_CARE_PROVIDER_SITE_OTHER)
Admission: RE | Admit: 2022-07-04 | Discharge: 2022-07-04 | Disposition: A | Discharge status: Home / Self Care | Payer: Medicare HMO | Source: Ambulatory Visit | Attending: Gastroenterology | Admitting: Gastroenterology

## 2022-07-04 ENCOUNTER — Encounter: Payer: Self-pay | Admitting: Gastroenterology

## 2022-07-04 ENCOUNTER — Ambulatory Visit (AMBULATORY_SURGERY_CENTER): Payer: Medicare HMO | Admitting: Gastroenterology

## 2022-07-04 VITALS — BP 116/80 | HR 77 | Temp 98.0°F | Resp 9 | Ht 69.0 in | Wt 194.0 lb

## 2022-07-04 DIAGNOSIS — D131 Benign neoplasm of stomach: Secondary | ICD-10-CM

## 2022-07-04 DIAGNOSIS — K219 Gastro-esophageal reflux disease without esophagitis: Secondary | ICD-10-CM

## 2022-07-04 DIAGNOSIS — Z1211 Encounter for screening for malignant neoplasm of colon: Secondary | ICD-10-CM

## 2022-07-04 DIAGNOSIS — G4733 Obstructive sleep apnea (adult) (pediatric): Secondary | ICD-10-CM | POA: Diagnosis not present

## 2022-07-04 DIAGNOSIS — R1084 Generalized abdominal pain: Secondary | ICD-10-CM | POA: Diagnosis not present

## 2022-07-04 DIAGNOSIS — Z800 Family history of malignant neoplasm of digestive organs: Secondary | ICD-10-CM

## 2022-07-04 DIAGNOSIS — R109 Unspecified abdominal pain: Secondary | ICD-10-CM | POA: Diagnosis not present

## 2022-07-04 DIAGNOSIS — K317 Polyp of stomach and duodenum: Secondary | ICD-10-CM | POA: Diagnosis not present

## 2022-07-04 DIAGNOSIS — J45909 Unspecified asthma, uncomplicated: Secondary | ICD-10-CM | POA: Diagnosis not present

## 2022-07-04 MED ORDER — SODIUM CHLORIDE 0.9 % IV SOLN
500.0000 mL | Freq: Once | INTRAVENOUS | Status: DC
Start: 2022-07-04 — End: 2022-07-04

## 2022-07-04 NOTE — Progress Notes (Signed)
Pt resting comfortably. VSS. Airway intact. SBAR complete to RN. All questions answered.   

## 2022-07-04 NOTE — Op Note (Signed)
[facility] Patient Name: Dalton Kidd Procedure Date: 07/04/2022 1:50 PM MRN: [id number] Endoscopist: Lorin Picket E. Tomasa Rand , MD, [id number] Age: 77 Referring MD:  Date of Birth: 02-07-1946 Gender: Male Account #: [id number] Procedure:                Colonoscopy Indications:              Screening in patient at increased risk: Colorectal                            cancer in brother before age 26 Medicines:                Monitored Anesthesia Care Procedure:                Pre-Anesthesia Assessment:                           - Prior to the procedure, a History and Physical                            was performed, and patient medications and                            allergies were reviewed. The patient's tolerance of                            previous anesthesia was also reviewed. The risks                            and benefits of the procedure and the sedation                            options and risks were discussed with the patient.                            All questions were answered, and informed consent                            was obtained. Prior Anticoagulants: The patient has                            taken no anticoagulant or antiplatelet agents. ASA                            Grade Assessment: III - A patient with severe                            systemic disease. After reviewing the risks and                            benefits, the patient was deemed in satisfactory                            condition to undergo the procedure.  After obtaining informed consent, the colonoscope                            was passed under direct vision. Throughout the                            procedure, the patient's blood pressure, pulse, and                            oxygen saturations were monitored continuously. The                            Olympus CF-HQ190L SN [id number] was introduced through                            the anus and  advanced to the the transverse colon                            before being aborted. The colonoscopy was extremely                            difficult due to a redundant colon, significant                            looping and a tortuous colon. Successful completion                            of the procedure was not achieved despite changing                            the patient to a supine position, changing the                            patient to a prone position, using manual pressure,                            using an abdominal binder and withdrawing and                            reinserting the scope. The patient tolerated the                            procedure well. The quality of the bowel                            preparation was good. No anatomical landmarks were                            photographed. The bowel preparation used was                            MoviPrep via split dose instruction. Scope In: 2:04:58 PM Scope Out: 2:42:44 PM Total Procedure Duration: 0 hours 37  minutes 46 seconds  Findings:                 The perianal and digital rectal examinations were                            normal. Pertinent negatives include normal                            sphincter tone and no palpable rectal lesions.                           The colon (entire examined portion) appeared normal. Complications:            No immediate complications. Estimated Blood Loss:     Estimated blood loss: none. Impression:               - The entire examined colon is normal.                           - No specimens collected.                           - Incomplete colonoscopy due excessive looping and                            a tortuous, redundant colon despite use of                            abdominal binder, manual pressure, supine position                            and prone position. Recommendation:           - Patient has a contact number available for                             emergencies. The signs and symptoms of potential                            delayed complications were discussed with the                            patient. Return to normal activities tomorrow.                            Written discharge instructions were provided to the                            patient.                           - Resume previous diet.                           - Continue present medications.                           -  Consider virtual colonoscopy or Cologuard. Juda Toepfer E. Tomasa Rand, MD 07/04/2022 2:55:27 PM This report has been signed electronically.

## 2022-07-04 NOTE — Progress Notes (Signed)
Patient had abdominal binder on when going to admitting and was resting comfortably. He was unable to pass gas for 10 minutes or so but then was able to pass a small amount. After taking his binder off he was soft but tender. After 10-15 minutes passed he became more tender and firm. He was given medication for his cramping and was able to pass more gas but still not feeling great. We ambulated to the bathroom where he sat down with his care partner but was unable to get more relief. MD ordered an abdominal xray and patient is going to xray now.

## 2022-07-04 NOTE — Progress Notes (Signed)
Pt's states no medical or surgical changes since previsit or office visit. 

## 2022-07-04 NOTE — Progress Notes (Signed)
Called to room to assist during endoscopic procedure.  Patient ID and intended procedure confirmed with present staff. Received instructions for my participation in the procedure from the performing physician.  

## 2022-07-04 NOTE — Patient Instructions (Signed)
YOU HAD AN ENDOSCOPIC PROCEDURE TODAY AT THE [facility]:   Refer to the procedure report that was given to you for any specific questions about what was found during the examination.  If the procedure report does not answer your questions, please call your gastroenterologist to clarify.  If you requested that your care partner not be given the details of your procedure findings, then the procedure report has been included in a sealed envelope for you to review at your convenience later.  YOU SHOULD EXPECT: Some feelings of bloating in the abdomen. Passage of more gas than usual.  Walking can help get rid of the air that was put into your GI tract during the procedure and reduce the bloating. If you had a lower endoscopy (such as a colonoscopy or flexible sigmoidoscopy) you may notice spotting of blood in your stool or on the toilet paper. If you underwent a bowel prep for your procedure, you may not have a normal bowel movement for a few days.  Please Note:  You might notice some irritation and congestion in your nose or some drainage.  This is from the oxygen used during your procedure.  There is no need for concern and it should clear up in a day or so.  SYMPTOMS TO REPORT IMMEDIATELY:  Following lower endoscopy (colonoscopy or flexible sigmoidoscopy):  Excessive amounts of blood in the stool  Significant tenderness or worsening of abdominal pains  Swelling of the abdomen that is new, acute  Fever of 100F or higher  Following upper endoscopy (EGD)  Vomiting of blood or coffee ground material  New chest pain or pain under the shoulder blades  Painful or persistently difficult swallowing  New shortness of breath  Fever of 100F or higher  Black, tarry-looking stools  For urgent or emergent issues, a gastroenterologist can be reached at any hour by calling [phone]. Do not use MyChart messaging for urgent concerns.    DIET:  We do recommend a small meal at first, but  then you may proceed to your regular diet.  Drink plenty of fluids but you should avoid alcoholic beverages for 24 hours.  ACTIVITY:  You should plan to take it easy for the rest of today and you should NOT DRIVE or use heavy machinery until tomorrow (because of the sedation medicines used during the test).    FOLLOW UP: Our staff will call the number listed on your records the next business day following your procedure.  We will call around 7:15- 8:00 am to check on you and address any questions or concerns that you may have regarding the information given to you following your procedure. If we do not reach you, we will leave a message.     If any biopsies were taken you will be contacted by phone or by letter within the next 1-3 weeks.  Please call us at [phone] if you have not heard about the biopsies in 3 weeks.    SIGNATURES/CONFIDENTIALITY: You and/or your care partner have signed paperwork which will be entered into your electronic medical record.  These signatures attest to the fact that that the information above on your After Visit Summary has been reviewed and is understood.  Full responsibility of the confidentiality of this discharge information lies with you and/or your care-partner. 

## 2022-07-04 NOTE — Progress Notes (Signed)
History and Physical Interval Note:  07/04/2022 1:45 PM  Dalton Kidd  has presented today for endoscopic procedure(s), with the diagnosis of  Encounter Diagnoses  Name Primary?   Family history of colon cancer Yes   Gastroesophageal reflux disease, unspecified whether esophagitis present   .  The various methods of evaluation and treatment have been discussed with the patient and/or family. After consideration of risks, benefits and other options for treatment, the patient has consented to  the endoscopic procedure(s).   The patient's history has been reviewed, patient examined, no change in status, stable for endoscopic procedure(s).  I have reviewed the patient's chart and labs.  Questions were answered to the patient's satisfaction.     Endrit Gittins E. Tomasa Rand, MD [facility]

## 2022-07-04 NOTE — Op Note (Signed)
[facility] Patient Name: Dalton Kidd Procedure Date: 07/04/2022 1:51 PM MRN: [id number] Endoscopist: Lorin Picket E. Tomasa Rand , MD, [id number] Age: 77 Referring MD:  Date of Birth: 30-Jul-1945 Gender: Male Account #: [id number] Procedure:                Upper GI endoscopy Indications:              Suspected esophageal reflux, Laryngitis Medicines:                Monitored Anesthesia Care Procedure:                Pre-Anesthesia Assessment:                           - Prior to the procedure, a History and Physical                            was performed, and patient medications and                            allergies were reviewed. The patient's tolerance of                            previous anesthesia was also reviewed. The risks                            and benefits of the procedure and the sedation                            options and risks were discussed with the patient.                            All questions were answered, and informed consent                            was obtained. Prior Anticoagulants: The patient has                            taken no anticoagulant or antiplatelet agents. ASA                            Grade Assessment: III - A patient with severe                            systemic disease. After reviewing the risks and                            benefits, the patient was deemed in satisfactory                            condition to undergo the procedure.                           After obtaining informed consent, the endoscope was  passed under direct vision. Throughout the                            procedure, the patient's blood pressure, pulse, and                            oxygen saturations were monitored continuously. The                            GIF [id number] #[id number] was introduced through the                            mouth, and advanced to the second part of duodenum.                            The upper  GI endoscopy was accomplished without                            difficulty. The patient tolerated the procedure                            well. Scope In: Scope Out: Findings:                 The examined portions of the nasopharynx,                            oropharynx and larynx were normal.                           The examined esophagus was normal.                           A 3 cm hiatal hernia was present.                           A single 5 mm sessile polyp with no stigmata of                            recent bleeding was found in the gastric body. The                            polyp was removed with a cold snare. Resection and                            retrieval were complete. Estimated blood loss was                            minimal.                           The exam of the stomach was otherwise normal.                           The examined duodenum was normal. Complications:  No immediate complications. Estimated Blood Loss:     Estimated blood loss was minimal. Impression:               - The examined portions of the nasopharynx,                            oropharynx and larynx were normal.                           - Normal esophagus.                           - 3 cm hiatal hernia.                           - A single gastric polyp. Resected and retrieved.                           - Normal examined duodenum.                           - Although no objective evidence of reflux, the                            patient has a hiatal hernia which predisposes him                            to acid reflux as being a potential source of his                            hoarseness Recommendation:           - Patient has a contact number available for                            emergencies. The signs and symptoms of potential                            delayed complications were discussed with the                            patient. Return to normal activities  tomorrow.                            Written discharge instructions were provided to the                            patient.                           - Resume previous diet.                           - Continue present medications.                           - Await pathology results.                           -  Consider pH/impedance testing for more definitive                            assessment of gastroesophageal reflux. Mychele Seyller E. Tomasa Rand, MD 07/04/2022 2:49:28 PM This report has been signed electronically.

## 2022-07-05 ENCOUNTER — Telehealth: Payer: Self-pay | Admitting: *Deleted

## 2022-07-05 NOTE — Telephone Encounter (Signed)
Follow up call attempt.  LVM to call if any questions or concerns. 

## 2022-07-10 ENCOUNTER — Other Ambulatory Visit: Payer: Self-pay

## 2022-07-10 ENCOUNTER — Telehealth: Payer: Self-pay

## 2022-07-10 DIAGNOSIS — Q438 Other specified congenital malformations of intestine: Secondary | ICD-10-CM

## 2022-07-10 DIAGNOSIS — Z800 Family history of malignant neoplasm of digestive organs: Secondary | ICD-10-CM

## 2022-07-10 NOTE — Telephone Encounter (Signed)
-----   Message from Jenel Lucks, MD sent at 07/05/2022  5:51 AM EDT ----- Regarding: Virtual colonoscopy Bonita Quin,  Can you place a referral for a virtual colonoscopy for this patient?  Was unable to complete colonoscopy yesterday due to excessive looping/redundant colon. Thanks

## 2022-07-10 NOTE — Telephone Encounter (Signed)
Pt scheduled for virtual colon at [facility] 08/12/22 at 8am, pt to arrive there at 7:40am. Pt needs to pick up prep 4 days prior 6/26 at [address]. Left message for pt to call back.

## 2022-07-12 NOTE — Telephone Encounter (Signed)
Pt sent mychart message with the appt information.

## 2022-07-15 NOTE — Progress Notes (Signed)
Dalton Kidd,  The polyp resected from your stomach was a benign fundic gland polyp.  There was no evidence of infection with Helicobacter pylori or intestinal metaplasia/dysplasia.  These types of polyps are typically secondary to acid suppression therapy and no specific follow-up is required for these small, benign polyps. Please proceed with the virtual colonoscopy to complete your colon cancer screening.  Consider pH/impedance testing for further evaluation of GERD as the cause of your hoarseness.  Let us know if you would like to discuss this further.

## 2022-08-12 ENCOUNTER — Other Ambulatory Visit: Payer: Medicare HMO

## 2022-09-11 ENCOUNTER — Ambulatory Visit
Admission: RE | Admit: 2022-09-11 | Discharge: 2022-09-11 | Disposition: A | Discharge status: Home / Self Care | Payer: Medicare HMO | Source: Ambulatory Visit | Attending: Gastroenterology | Admitting: Gastroenterology

## 2022-09-11 DIAGNOSIS — Z800 Family history of malignant neoplasm of digestive organs: Secondary | ICD-10-CM | POA: Diagnosis not present

## 2022-09-11 DIAGNOSIS — K6389 Other specified diseases of intestine: Secondary | ICD-10-CM | POA: Diagnosis not present

## 2022-09-11 DIAGNOSIS — Q438 Other specified congenital malformations of intestine: Secondary | ICD-10-CM

## 2022-09-17 NOTE — Progress Notes (Signed)
Mr. Rehmer,  Your virtual colonoscopy did not show any polyps or other significant abnormalities.  Note was made of calcifications in the vessels in heart and aorta.  This was also noted on CT scans in 2022.  If you are having any symptoms of shortness of breath or chest pain, you want to discuss seeing a cardiologist for further evaluation. Given your age, lack of polyps on previous examinations, and lack of polyps on this examination, I would recommend against further colon cancer screening.

## 2022-11-07 ENCOUNTER — Encounter: Payer: Self-pay | Admitting: Internal Medicine

## 2022-11-07 DIAGNOSIS — Z0000 Encounter for general adult medical examination without abnormal findings: Secondary | ICD-10-CM

## 2023-02-21 ENCOUNTER — Ambulatory Visit: Payer: Medicare HMO | Attending: Internal Medicine

## 2023-02-21 ENCOUNTER — Other Ambulatory Visit: Payer: Self-pay

## 2023-02-21 DIAGNOSIS — R41841 Cognitive communication deficit: Secondary | ICD-10-CM | POA: Diagnosis not present

## 2023-02-21 DIAGNOSIS — R4701 Aphasia: Secondary | ICD-10-CM | POA: Insufficient documentation

## 2023-02-21 NOTE — Patient Instructions (Signed)
   The chart from the [facility] was challenging to see exactly what occurred during your appointments there, and the reason behind why you came to see me today.  In addition, it was difficult for you to give me details about those appointments; I recommend your wife attend [facility] appointments with you to help you recall details from those appointments. Because of this I recommend your wife come with you to speech therapy once every two weeks.   I need to do a little more testing with you - we will do that next session.  We will work on your ability to name, spell, and remember things. I look forward to working with you.

## 2023-02-21 NOTE — Therapy (Addendum)
 OUTPATIENT SPEECH LANGUAGE PATHOLOGY EVALUATION   Patient Name: Dalton Kidd MRN: [id number] DOB:Jul 10, 1945, 78 y.o., male Today's Date: 02/21/2023  [facility] REFERRING PROVIDER: Virgia Cones, MD ([facility])  END OF SESSION:  End of Session - 02/21/23 1601     Visit Number 1    Number of Visits 17    Date for SLP Re-Evaluation 04/25/23    SLP Start Time 0803    SLP Stop Time  0847    SLP Time Calculation (min) 44 min    Activity Tolerance Patient tolerated treatment well             Past Medical History:  Diagnosis Date   Depression    GERD (gastroesophageal reflux disease)    Hyperlipidemia    Sleep apnea    Past Surgical History:  Procedure Laterality Date   ANKLE FRACTURE SURGERY Left    FEMUR SURGERY Left    THUMB ARTHROSCOPY Left    Patient Active Problem List   Diagnosis Date Noted   Hoarseness 11/06/2021   Mild persistent asthma without complication 03/09/2021   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 (Pt, re: the results of neuropsych testing on 11/28/22 with [facility] Dr. Dorothyann Kidd) Pt accompanied by: self  PERTINENT HISTORY: Scant history regarding etiology or more details about pt's sx are available at this time. ([facility] referral) Pt states he has difficulty spelling words at this time, and his writing gets very small by the end of the sentence. He had app[facility] with [facility]ist on 11/26/22 for parasthesias and 11/29/22.  PAIN:  Are you having pain? Yes: NPRS scale: 3 Pain location: lower back Pain description:  sore Aggravating factors: sitting still Relieving factors: moving  FALLS: Has patient fallen in last 6 months?  Yes, Number of falls: 1, 11/10/22  LIVING ENVIRONMENT: Lives with: lives with their spouse Lives in: House/apartment  PLOF:  Level of assistance: Independent with ADLs, Independent with IADLs Employment: Retired  PATIENT GOALS: I used to be able to spell - didn't need any help.  OBJECTIVE:  Note: Objective measures were completed at Evaluation unless otherwise noted.  DIAGNOSTIC FINDINGS:  Report Status: Verified Date Reported: Nov 11, 2022 Date Verified: Nov 11, 2022 Verifier E-Sig:/ES/P Vergara-Wentland MD  Report: CT HEAD WITHOUT CONTRAST  HISTORY: S/p fall and hit head last night. No loc  COMPARISON: Head CT scan 05/24/2022.   TECHNIQUE: Axial CT images of the brain from skull base to  vertex, including portions of the face and sinuses, were obtained  without contrast. Supplemental 2-D reformatted images were  generated and reviewed as needed.   FINDINGS:   Calvarium/skull base: No evidence of fracture or destructive  lesion. Mastoids and middle ear grossly clear.   Paranasal sinuses: Imaged portions clear.   Brain: No evidence of acute large territory of infarct. Mild  hypodensity involving the periventricular white matter is  nonspecific but probably consistent with chronic microvascular  ischemic changes. No mass effect, mass lesion, acute hemorrhage,  or hydrocephalus.   Impression: 1. No CT evidence of acute intracranial abnormality.  Electronically Signed  By: Dalton Kidd Electronically Signed On: 11/11/2022 8:37 AM    Neuropsychological Test Results Name: Dalton Kidd Age: 29 years Date Tested: 11/28/22  Test Results: - MMSE: 27/30 correct; You were alert and generally oriented (i.e., you were  unsure of the county where this clinic is located), and you lost additional  points tasks assessing recall (2/3 correct) and  copying. - Clock Drawing: Intact and Accurate - Cognitive Test Results o Impaired: Simple Attention, Naming, Writing Skills, Lexical Fluency, List  Learning, Scientist, Research (life Sciences)  o Intact: Working Memory*, Visual Scanning Speed, Visual Tracking Speed,  Graphomotor Speed*, Semantic Fluency, Oral Production, Auditory  Comprehension, Visual Discrimination, List Memory, Visual Learning and  Memory, Verbal Abstract Reasoning, Visual Abstract Reasoning*, Mental  Flexibility - Psychological Considerations: Consistent with your report during the  clinical interview, there is evidence of mild depression (GDS, 10/30).   * Statistically within normal limits, but lower than expected  When examining your serial neuropsychological testing (2018 and 2024), there was  evidence of: - STABLE (AND INTACT) performance on measures of visual scanning speed,  visual tracking speed, semantic fluency, auditory comprehension, visual  discrimination, list memory, visual learning/memory, verbal abstract  reasoning, and mental flexibility - REDUCED performance on measures of simple attention span, lexical  fluency, and list learning. Although still within normal limits, your scores  DECLINED SLIGHTLY on measures of graphomotor speed and working memory.  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  This diagnosis is based on your medical and psychological history, report of  symptoms onset, and measured cognitive difficulties. The severity is mild due  to remaining independent in all activities of daily living at this time. Your  cognitive issues are likely exacerbated at times by your physical discomfort and  mild psychiatric distress.   Recommendations:  1. General compensatory strategies such as using lists and reminders,   structuring the day, and increasing routines may be helpful. Participation in  the [facility] group may be particularly helpful. 2. Given your reported decline in spelling and writing, and your measured  decline in multiple areas involving language skills, consultation with Speech  Language Therapy may be helpful. You are also encouraged to follow up with  neurology for help with differential diagnosis and treatment. 3. Re-evaluation of your cognitive difficulties is recommended in 18 to 24  months. This will allow us  to evaluate any progression of your symptoms and  to assist with treatment planning.  4. Continued mental health treatment is also strongly recommended. 5. It is also strongly recommended that you continue to work with your  medical team to address your physical symptoms (e.g., chronic pain) which can  cause distractibility and reduce cognitive efficiency. 6. You should continue to engage in leisure and social activities.  7. Activities and practices related to general health and wellbeing are  recommended, such as good sleep hygiene, regular exercise, eating a heart- healthy diet, and consuming alcohol and caffeine in moderation.  8. Information on [facility] ([facility] electronic record access for  patients) was provided. 9. If you find yourself in crisis, please call the Crisis Line phone number  ([phone], then press [phone]), go to the ER, or call emergency services.  For additional questions, please feel free to call Dr. Dorothyann Kidd at  [phone], x. [phone]   COGNITION: Overall cognitive status: Impaired and History  of cognitive impairments - at baseline Areas of impairment:  Attention: Impaired: Sustained, Selective Memory: Impaired: Immediate Working Short term Nurse, Learning Disability deficits: Pt could not recall attending neurology or neuropsych appointments at the [facility], nor could recall any notable details from those appointments (see s).  SLP TO PERFORM  COGNITIVE LINGUISTIC QUICK TEST (CLQT) next session.  EXPRESSION: verbal  VERBAL EXPRESSION: Level of generative/spontaneous verbalization: conversation Naming: Confrontation: 76-100% Pragmatics: Appears intact Comments: Pt demonstrated minimal anomia during conversation with SLP during simple to mod complex conversation Interfering components: attention Non-verbal means of communication: N/A  WRITTEN EXPRESSION: Dominant hand: right Written expression: Impaired: word, phrase, and sentence; Pt demonstrated aphasia in his writing of single words 56% success. Of these errors, he demonstrated awareness of errors 60% (4/7).  MOTOR SPEECH: Overall motor speech: Appears intact  ORAL MOTOR EXAMINATION: Overall status: WFL  STANDARDIZED ASSESSMENTS: [place] NAMING TEST: Pt scored 26/30 on odds presentation. This is outside the normal range. SLP suspects, given pt's occupation that this should be at least average.  PATIENT REPORTED OUTCOME MEASURES (PROM): To be provided in first 2 sessions                                                                                                                            TREATMENT DATE:  02/21/23: N/A  PATIENT EDUCATION: Education details: SLP role in ST, pt will need cognitive communication evaluation the first session, wife needs to come to medical appointments given pt's inability to give any details or recall the doctors he saw in the last 2 1/2 months. Person educated: Patient Education method: Explanation and Handouts Education comprehension: verbalized understanding and needs further education   GOALS: Goals reviewed with patient? No  SHORT TERM GOALS: Target date: 03/21/23  Pt will complete CLQT Baseline: Goal status: INITIAL  2.  Pt will name 10 items in salient category for pt in one minute Baseline:  Goal status: INITIAL  3.  Pt will tell SLP two memory strategies he could use Baseline:  Goal status: INITIAL  4. Pt will  note 85% of written errors at the phrase level with compensations used PRN Baseline:  Goal status: INITIAL  LONG TERM GOALS: Target date: 04/18/23  Pt will use one memory strategy in a session x3 Baseline:  Goal status: INITIAL  2.  Pt will use anomia strategies in 10 minutes functional simple to mod complex conversation in 3 sessions Baseline:  Goal status: INITIAL  3.  Pt will use a memory strategy in a session x5 Baseline:  Goal status: INITIAL  ASSESSMENT:  CLINICAL IMPRESSION: Patient is a 78 y.o. M who was seen today for assessment of anomia. Pt performance with BNT-2 was in the very low normal range, and pt demonstrated written aphasia with reduced error awareness. Pt also demonstrates memory deficit in that he could not provide SLP any details of any pertinent medical appointment to this referral (neurology, neuropsych) in mid-October. Dalton underwent neuropsych evaluation  which highlighted deficits in memory, abstract reasoning, semantic fluency, and attention. 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 Due to pt's difficulty recalling details of medical appointments, SLP strongly suggested pt bring wife to important medical appointments to assist pt with recall of details in these situations. Pt will undergo CLQT in first 1-2 sessions.   OBJECTIVE IMPAIRMENTS: include attention, memory, and aphasia. These impairments are limiting patient from managing appointments, household responsibilities, ADLs/IADLs, and effectively communicating at home and in community. Factors affecting potential to achieve goals and functional outcome are ability to learn/carryover information. Patient will benefit from skilled SLP services to address above impairments and improve overall function.  REHAB POTENTIAL: Good  PLAN:  SLP FREQUENCY: 2x/week  SLP DURATION: 8 weeks  PLANNED INTERVENTIONS: Language facilitation, Environmental controls, Cueing hierachy, Cognitive reorganization,  Internal/external aids, Functional tasks, Multimodal communication approach, SLP instruction and feedback, Compensatory strategies, Patient/family education, and [id number] Treatment of speech (30 or 45 min)     Randalyn Ahmed, CCC-SLP 02/21/2023, 4:02 PM

## 2023-02-24 ENCOUNTER — Ambulatory Visit: Payer: Medicare HMO

## 2023-02-24 DIAGNOSIS — R4701 Aphasia: Secondary | ICD-10-CM

## 2023-02-24 DIAGNOSIS — R41841 Cognitive communication deficit: Secondary | ICD-10-CM

## 2023-02-24 NOTE — Therapy (Addendum)
 OUTPATIENT SPEECH LANGUAGE PATHOLOGY TREATMENT   Patient Name: Dalton Kidd MRN: [id number] DOB:06/01/1945, 78 y.o., male Today's Date: 02/28/2023  [facility] REFERRING PROVIDER: Virgia Cones, [facility] ([facility]  END OF SESSION:  End of Session - 02/24/23       Visit Number 2     Number of Visits 17     Date for SLP Re-Evaluation 04/25/23     SLP Start Time 1233     SLP Stop Time  1315    SLP Time Calculation (min) 42 min     Activity Tolerance Patient tolerated treatment well     Past Medical History:  Diagnosis Date   Depression    GERD (gastroesophageal reflux disease)    Hyperlipidemia    Sleep apnea    Past Surgical History:  Procedure Laterality Date   ANKLE FRACTURE SURGERY Left    FEMUR SURGERY Left    THUMB ARTHROSCOPY Left    Patient Active Problem List   Diagnosis Date Noted   Hoarseness 11/06/2021   Mild persistent asthma without complication 03/09/2021   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 (Pt, re: the results of neuropsych testing on 11/28/22 with [facility] Dr. Dorothyann Kidd) Pt accompanied by: self  PERTINENT HISTORY: Scant history regarding etiology or more details about pt's sx are available at this time. ([facility] referral) Pt states he has difficulty spelling words at this time, and his writing gets very small by the end of the sentence. He [facility] on 11/26/22 for parasthesias and 11/29/22.  PAIN:  Are you having pain? Yes: NPRS scale: 3 Pain location: lower back Pain description: sore Aggravating  factors: sitting still Relieving factors: moving  FALLS: Has patient fallen in last 6 months?  Yes, Number of falls: 1, 11/10/22   PATIENT GOALS: I used to be able to spell - didn't need any help.  OBJECTIVE:  Note: Objective measures were completed at Evaluation unless otherwise noted.  DIAGNOSTIC FINDINGS:  Report Status: Verified Date Reported: Nov 11, 2022 Date Verified: Nov 11, 2022 Verifier E-Sig:/ES/P Vergara-Wentland MD  Report: CT HEAD WITHOUT CONTRAST  HISTORY: S/p fall and hit head last night. No loc  COMPARISON: Head CT scan 05/24/2022.   TECHNIQUE: Axial CT images of the brain from skull base to  vertex, including portions of the face and sinuses, were obtained  without contrast. Supplemental 2-D reformatted images were  generated and reviewed as needed.   FINDINGS:   Calvarium/skull base: No evidence of fracture or destructive  lesion. Mastoids and middle ear grossly clear.   Paranasal sinuses: Imaged portions clear.   Brain: No evidence of acute large territory of infarct. Mild  hypodensity involving the periventricular white matter is  nonspecific but probably consistent with chronic microvascular  ischemic changes. No mass effect, mass lesion, acute hemorrhage,  or hydrocephalus.   Impression: 1. No CT evidence of acute intracranial abnormality.  Electronically Signed By: Dalton Kidd Electronically Signed On: 11/11/2022 8:37 AM    Neuropsychological Test Results Name: Dalton Kidd Age: 83 years Date Tested: 11/28/22  Test Results: -  MMSE: 27/30 correct; You were alert and generally oriented (i.e., you were  unsure of the county where this clinic is located), and you lost additional  points tasks assessing recall (2/3 correct) and copying. - Clock Drawing: Intact and Accurate - Cognitive Test Results o Impaired: Simple Attention, Naming, Writing Skills, Lexical Fluency, List  Learning, Scientist, Research (life Sciences)  o Intact: Working Memory*, Visual  Scanning Speed, Visual Tracking Speed,  Graphomotor Speed*, Semantic Fluency, Oral Production, Auditory  Comprehension, Visual Discrimination, List Memory, Visual Learning and  Memory, Verbal Abstract Reasoning, Visual Abstract Reasoning*, Mental  Flexibility - Psychological Considerations: Consistent with your report during the  clinical interview, there is evidence of mild depression (GDS, 10/30).   * Statistically within normal limits, but lower than expected  When examining your serial neuropsychological testing (2018 and 2024), there was  evidence of: - STABLE (AND INTACT) performance on measures of visual scanning speed,  visual tracking speed, semantic fluency, auditory comprehension, visual  discrimination, list memory, visual learning/memory, verbal abstract  reasoning, and mental flexibility - REDUCED performance on measures of simple attention span, lexical  fluency, and list learning. Although still within normal limits, your scores  DECLINED SLIGHTLY on measures of graphomotor speed and working memory.  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  This diagnosis is based on your medical and psychological history, report of  symptoms onset, and measured cognitive difficulties. The severity is mild due  to remaining independent in all activities of daily living at this time. Your  cognitive issues are likely exacerbated at times by your physical discomfort and  mild psychiatric distress.   Recommendations:  1. General compensatory strategies such as using lists and reminders,  structuring the day, and increasing routines may be helpful. Participation in  the [facility] group may be particularly helpful. 2. Given your reported decline in spelling and writing, and your measured   decline in multiple areas involving language skills, consultation with Speech  Language Therapy may be helpful. You are also encouraged to follow up with  neurology for help with differential diagnosis and treatment. 3. Re-evaluation of your cognitive difficulties is recommended in 18 to 24  months. This will allow us  to evaluate any progression of your symptoms and  to assist with treatment planning.  4. Continued mental health treatment is also strongly recommended. 5. It is also strongly recommended that you continue to work with your  medical team to address your physical symptoms (e.g., chronic pain) which can  cause distractibility and reduce cognitive efficiency. 6. You should continue to engage in leisure and social activities.  7. Activities and practices related to general health and wellbeing are  recommended, such as good sleep hygiene, regular exercise, eating a heart- healthy diet, and consuming alcohol and caffeine in moderation.  8. Information on [facility] ([facility] electronic record access for  patients) was provided. 9. If you find yourself in crisis, please call the Crisis Line phone number  ([phone], then press [phone]), go to the ER, or call emergency services.  For additional questions, please feel free to call Dr. Dorothyann Kidd at  [phone], x. [phone]    PATIENT REPORTED OUTCOME MEASURES (PROM): To be provided in first 2 sessions  TREATMENT DATE:  02/24/23: CLQT initiated. SLP interviewed pt's wife re: pt's deficit areas, who states that she thinks pt's memory and word retrieval skills are age-appropriate and do not alarm her. Pt uses calendar app for appointments, has auto-pay for bills, and reports no difficulty taking meds. SLP suggested wife ask pt about details from ST appointments and if she suspects pt's performance less than it should  be, she should cont to attend ST and should consider attending [facility] appointments with pt.   02/21/23: N/A  PATIENT EDUCATION: Education details: Role of SLP in ST Person educated: Patient and Spouse Education method: Explanation Education comprehension: verbalized understanding and needs further education   GOALS: Goals reviewed with patient? No  SHORT TERM GOALS: Target date: 03/21/23  Pt will complete CLQT Baseline: Goal status: INITIAL  2.  Pt will name 10 items in salient category for pt in one minute Baseline:  Goal status: INITIAL  3.  Pt will tell SLP two memory strategies he could use Baseline:  Goal status: INITIAL  4. Pt will note 85% of written errors at the phrase level with compensations used PRN Baseline:  Goal status: INITIAL  LONG TERM GOALS: Target date: 04/18/23  Pt will use one memory strategy in a session x3 Baseline:  Goal status: INITIAL  2.  Pt will use anomia strategies in 10 minutes functional simple to mod complex conversation in 3 sessions Baseline:  Goal status: INITIAL  3.  Pt will use a memory strategy in a session x5 Baseline:  Goal status: INITIAL  ASSESSMENT:  CLINICAL IMPRESSION: Patient is a 78 y.o. M who was seen today for assessment of cognitive communication; CLQT initiated. Jaelynn underwent neuropsych evaluation which highlighted deficits in memory, abstract reasoning, semantic fluency, and attention. 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 Wife came to ST today.    OBJECTIVE IMPAIRMENTS: include attention, memory, and aphasia. These impairments are limiting patient from managing appointments, household responsibilities, ADLs/IADLs, and effectively communicating at home and in community. Factors affecting potential to achieve goals and functional outcome are ability to learn/carryover information. Patient will benefit from skilled SLP services to address above impairments and improve overall function.  REHAB POTENTIAL:  Good  PLAN:  SLP FREQUENCY: 2x/week  SLP DURATION: 8 weeks  PLANNED INTERVENTIONS: Language facilitation, Environmental controls, Cueing hierachy, Cognitive reorganization, Internal/external aids, Functional tasks, Multimodal communication approach, SLP instruction and feedback, Compensatory strategies, Patient/family education, and [id number] Treatment of speech (30 or 45 min)     Tarin Johndrow, CCC-SLP 02/28/2023, 8:10 AM

## 2023-02-28 ENCOUNTER — Ambulatory Visit: Payer: Medicare HMO

## 2023-02-28 DIAGNOSIS — R4701 Aphasia: Secondary | ICD-10-CM

## 2023-02-28 DIAGNOSIS — R41841 Cognitive communication deficit: Secondary | ICD-10-CM | POA: Diagnosis not present

## 2023-02-28 NOTE — Therapy (Signed)
OUTPATIENT SPEECH LANGUAGE PATHOLOGY TREATMENT   Patient Name: Dalton Kidd MRN: [id number] DOB:[birth year], 78 y.o., male Today's Date: 02/28/2023  [facility] REFERRING PROVIDER: Edison Nasuti, MD ([facility]  END OF SESSION:  End of Session - 02/28/23 1015     Visit Number 3    Number of Visits 17    Date for SLP Re-Evaluation 04/25/23    SLP Start Time 0850    SLP Stop Time  0930    SLP Time Calculation (min) 40 min    Activity Tolerance Patient tolerated treatment well            Past Medical History:  Diagnosis Date   Depression    GERD (gastroesophageal reflux disease)    Hyperlipidemia    Sleep apnea    Past Surgical History:  Procedure Laterality Date   ANKLE FRACTURE SURGERY Left    FEMUR SURGERY Left    THUMB ARTHROSCOPY Left    Patient Active Problem List   Diagnosis Date Noted   Hoarseness 11/06/2021   Mild persistent asthma without complication 03/09/2021   Diffuse lung disease 01/20/2021   Shortness of breath 01/15/2021   Nodule of upper lobe of right lung 11/14/2020   Abnormal CT of the chest 11/14/2020    ONSET DATE: "A year or two"   REFERRING DIAG: F80.1 Expressive language disorder  THERAPY DIAG:  Aphasia  Cognitive communication deficit  Rationale for Evaluation and Treatment: Rehabilitation  SUBJECTIVE:   SUBJECTIVE STATEMENT: "I drop stuff all the time, and I fall back when trying to get up sometimes." Pt accompanied by: self  PERTINENT HISTORY: Scant history regarding etiology or more details about pt's sx are available at this time. ([facility] referral) Pt states he has difficulty spelling words at this time, and his writing gets very small by the end of the sentence. He [facility] app[facility]ist on 11/26/22 for parasthesias and 11/29/22.  PAIN:  Are you having pain? Yes: NPRS scale: 3 Pain location: lower back Pain description: sore Aggravating factors: sitting still Relieving factors: moving  FALLS: Has patient fallen in last  6 months?  Yes, Number of falls: 1, 11/10/22   PATIENT GOALS: "I used to be able to spell - didn't need any help."  OBJECTIVE:  Note: Objective measures were completed at Evaluation unless otherwise noted.  DIAGNOSTIC FINDINGS:  Report Status: Verified Date Reported: Nov 11, 2022 Date Verified: Nov 11, 2022 Verifier E-Sig:/ES/P Vergara-Wentland MD  Report: CT HEAD WITHOUT CONTRAST  HISTORY: S/p fall and hit head last night. No loc  COMPARISON: Head CT scan 05/24/2022.   TECHNIQUE: Axial CT images of the brain from skull base to  vertex, including portions of the face and sinuses, were obtained  without contrast. Supplemental 2-D reformatted images were  generated and reviewed as needed.   FINDINGS:   Calvarium/skull base: No evidence of fracture or destructive  lesion. Mastoids and middle ear grossly clear.   Paranasal sinuses: Imaged portions clear.   Brain: No evidence of acute large territory of infarct. Mild  hypodensity involving the periventricular white matter is  nonspecific but probably consistent with chronic microvascular  ischemic changes. No mass effect, mass lesion, acute hemorrhage,  or hydrocephalus.   Impression: 1. No CT evidence of acute intracranial abnormality.  Electronically Signed By: Thamas Jaegers Electronically Signed On: 11/11/2022 8:37 AM    Neuropsychological Test Results Name: Byson Gaylord Age: 76 years Date Tested: 11/28/22  Test Results: - MMSE: 27/30 correct; You were alert and generally  oriented (i.e., you were  unsure of the county where this clinic is located), and you lost additional  points tasks assessing recall (2/3 correct) and copying. - Clock Drawing: Intact and Accurate - Cognitive Test Results o Impaired: Simple Attention, Naming, Writing Skills, Lexical Fluency, List  Learning, Scientist, research (life sciences)  o Intact: Working Memory*, Visual Scanning Speed, Visual Tracking Speed,  Graphomotor Speed*, Semantic Fluency, Oral  Production, Auditory  Comprehension, Visual Discrimination, List Memory, Visual Learning and  Memory, Verbal Abstract Reasoning, Visual Abstract Reasoning*, Mental  Flexibility - Psychological Considerations: Consistent with your report during the  clinical interview, there is evidence of mild depression (GDS, 10/30).   * Statistically within normal limits, but lower than expected  When examining your serial neuropsychological testing (2018 and 2024), there was  evidence of: - STABLE (AND INTACT) performance on measures of visual scanning speed,  visual tracking speed, semantic fluency, auditory comprehension, visual  discrimination, list memory, visual learning/memory, verbal abstract  reasoning, and mental flexibility - REDUCED performance on measures of simple attention span, lexical  fluency, and list learning. Although still within normal limits, your scores  DECLINED SLIGHTLY on measures of graphomotor speed and working memory.  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  This diagnosis is based on your medical and psychological history, report of  symptoms onset, and measured cognitive difficulties. The severity is mild due  to remaining independent in all activities of daily living at this time. Your  cognitive issues are likely exacerbated at times by your physical discomfort and  mild psychiatric distress.   Recommendations:  1. General compensatory strategies such as using lists and reminders,  structuring the day, and increasing routines may be helpful. Participation in  the [facility] group may be particularly helpful. 2. Given your reported decline in spelling and writing, and your measured  decline in multiple areas involving language skills, consultation with Speech  Language  Therapy may be helpful. You are also encouraged to follow up with  neurology for help with differential diagnosis and treatment. 3. Re-evaluation of your cognitive difficulties is recommended in 18 to 24  months. This will allow [place] to evaluate any progression of your symptoms and  to assist with treatment planning.  4. Continued mental health treatment is also strongly recommended. 5. It is also strongly recommended that you continue to work with your  medical team to address your physical symptoms (e.g., chronic pain) which can  cause distractibility and reduce cognitive efficiency. 6. You should continue to engage in leisure and social activities.  7. Activities and practices related to general health and wellbeing are  recommended, such as good sleep hygiene, regular exercise, eating a heart- healthy diet, and consuming alcohol and caffeine in moderation.  8. Information on [facility]et ([facility] electronic record access for  patients) was provided. 9. If you find yourself in crisis, please call the Crisis Line phone number  ([phone], then press 1), go to the ER, or call emergency services.  For additional questions, please feel free to call Dr. Palma Holter at  [phone], [phone]. [phone]    PATIENT REPORTED OUTCOME MEASURES (PROM): To be provided in first 2 sessions  TREATMENT DATE:  02/28/23: Given "s" statement SLP wonders if pt appropritate for OT evaluation. Pt to consider and ask about script PRN.  When SLP told pt that it did not appear his wife was very concerned about the differences he has noticed and has sought ST for pt stated, "Yeah, she's not concerned about much at all."  SLP completed CLQT today; SLP wonders pt's visual processing due to slow response time with visual memory, and difficulty with design generation. Scores are below: The Cognitive  Linguistic Quick Test (CLQT) was administered to assess the relative status of five cognitive domains: attention, memory, language, executive functioning, and visuospatial skills. Scores from 10 tasks were used to estimate severity ratings (standardized for age groups 18-69 years and 70-89 years) for each domain, a clock drawing task, as well as an overall composite severity rating of cognition.      Task Score Criterion Cut Scores  Personal Facts 8/8 8  Symbol Cancellation 12/12 10  Confrontation Naming 10/10 10  Clock Drawing  12/13 11  Story Retelling 7/10 5  Symbol Trails 10/10 6  Generative Naming 5/9 4  Design Memory 2/6 4  Mazes  8/8 4  Design Generation 5/13 5    Cognitive Domain Composite Score Severity Rating  Attention 193/215 WNL  Memory 123/185 Mild  Executive Function 28/40 WNL  Language 30/37 Low-WNL  Visuospatial Skills 83/105 WNL  Clock Drawing  12/13 WNL  Composite Severity Rating  WNL     02/24/23: CLQT initiated. SLP interviewed pt's wife re: pt's deficit areas, who states that she thinks pt's memory and word retrieval skills are age-appropriate and do not "alarm" her. Pt uses calendar app for appointments, has auto-pay for bills, and reports no difficulty taking meds. SLP suggested wife ask pt about details from ST appointments and if she suspects pt's performance less than it should be, she should cont to attend ST and should consider attending [facility] appointments with pt.   02/21/23: N/A  PATIENT EDUCATION: Education details: Role of SLP in ST Person educated: Patient and Spouse Education method: Explanation Education comprehension: verbalized understanding and needs further education   GOALS: Goals reviewed with patient? No  SHORT TERM GOALS: Target date: 03/21/23  Pt will complete CLQT Baseline: Goal status: met  2.  Pt will name 10 items in salient category for pt in one minute Baseline:  Goal status: INITIAL  3.  Pt will tell SLP two memory  strategies he could use Baseline:  Goal status: INITIAL  4. Pt will note 85% of written errors at the phrase level with compensations used PRN Baseline:  Goal status: INITIAL   LONG TERM GOALS: Target date: 04/18/23  Pt will use one memory strategy in a session x3 Baseline:  Goal status: INITIAL  2.  Pt will use anomia strategies in 10 minutes functional simple to mod complex conversation in 3 sessions Baseline:  Goal status: INITIAL  3.  Pt will use a memory strategy in a session x5 Baseline:  Goal status: INITIAL  ASSESSMENT:  CLINICAL IMPRESSION: Patient is a 78 y.o. M who was seen today for assessment of cognitive communication; CLQT completed today. See "Treatmetn Date" for more details. Noah underwent neuropsych evaluation which highlighted deficits in memory, abstract reasoning, semantic fluency, and attention. 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   OBJECTIVE IMPAIRMENTS: include attention, memory, and aphasia. These impairments are limiting patient from managing appointments, household responsibilities, ADLs/IADLs, and effectively communicating at home and in community. Factors affecting  potential to achieve goals and functional outcome are ability to learn/carryover information. Patient will benefit from skilled SLP services to address above impairments and improve overall function.  REHAB POTENTIAL: Good  PLAN:  SLP FREQUENCY: 2x/week  SLP DURATION: 8 weeks  PLANNED INTERVENTIONS: Language facilitation, Environmental controls, Cueing hierachy, Cognitive reorganization, Internal/external aids, Functional tasks, Multimodal communication approach, SLP instruction and feedback, Compensatory strategies, Patient/family education, and 16109 Treatment of speech (30 or 45 min)     Kierre Hintz, CCC-SLP 02/28/2023, 10:19 AM

## 2023-03-03 ENCOUNTER — Ambulatory Visit: Payer: Medicare HMO

## 2023-03-03 DIAGNOSIS — R41841 Cognitive communication deficit: Secondary | ICD-10-CM | POA: Diagnosis not present

## 2023-03-03 DIAGNOSIS — R4701 Aphasia: Secondary | ICD-10-CM | POA: Diagnosis not present

## 2023-03-03 NOTE — Therapy (Addendum)
OUTPATIENT SPEECH LANGUAGE PATHOLOGY TREATMENT   Patient Name: Dalton Kidd MRN: [id number] DOB:07/10/1945, 78 y.o., male Today's Date: 03/03/2023  [facility] REFERRING PROVIDER: Edison Nasuti, MD ([facility])  END OF SESSION:  End of Session - 03/03/23 1723     Visit Number 4    Number of Visits 17    Date for SLP Re-Evaluation 04/25/23    SLP Start Time 1617    SLP Stop Time  1647    SLP Time Calculation (min) 30 min    Activity Tolerance Patient tolerated treatment well             Past Medical History:  Diagnosis Date   Depression    GERD (gastroesophageal reflux disease)    Hyperlipidemia    Sleep apnea    Past Surgical History:  Procedure Laterality Date   ANKLE FRACTURE SURGERY Left    FEMUR SURGERY Left    THUMB ARTHROSCOPY Left    Patient Active Problem List   Diagnosis Date Noted   Hoarseness 11/06/2021   Mild persistent asthma without complication 03/09/2021   Diffuse lung disease 01/20/2021   Shortness of breath 01/15/2021   Nodule of upper lobe of right lung 11/14/2020   Abnormal CT of the chest 11/14/2020    ONSET DATE: "A year or two"   REFERRING DIAG: F80.1 Expressive language disorder  THERAPY DIAG:  Aphasia  Cognitive communication deficit  Rationale for Evaluation and Treatment: Rehabilitation  SUBJECTIVE:   SUBJECTIVE STATEMENT: "I drop stuff all the time, and I fall back when trying to get up sometimes." Pt accompanied by: self  PERTINENT HISTORY: Scant history regarding etiology or more details about pt's sx are available at this time. ([facility]) Pt states he has difficulty spelling words at this time, and his writing gets very small by the end of the sentence. [facility]22 for parasthesias and 11/29/22.  PAIN:  Are you having pain? Yes: NPRS scale: 3 Pain location: lower back Pain description: sore Aggravating factors: sitting still Relieving factors: moving  FALLS: Has patient fallen in  last 6 months?  Yes, Number of falls: 1, 11/10/22   PATIENT GOALS: "I used to be able to spell - didn't need any help."  OBJECTIVE:  Note: Objective measures were completed at Evaluation unless otherwise noted.  DIAGNOSTIC FINDINGS:  Report Status: Verified Date Reported: Nov 11, 2022 Date Verified: Nov 11, 2022 Verifier E-Sig:/ES/P Vergara-Wentland MD  Report: CT HEAD WITHOUT CONTRAST  HISTORY: S/p fall and hit head last night. No loc  COMPARISON: Head CT scan 05/24/2022.   TECHNIQUE: Axial CT images of the brain from skull base to  vertex, including portions of the face and sinuses, were obtained  without contrast. Supplemental 2-D reformatted images were  generated and reviewed as needed.   FINDINGS:   Calvarium/skull base: No evidence of fracture or destructive  lesion. Mastoids and middle ear grossly clear.   Paranasal sinuses: Imaged portions clear.   Brain: No evidence of acute large territory of infarct. Mild  hypodensity involving the periventricular white matter is  nonspecific but probably consistent with chronic microvascular  ischemic changes. No mass effect, mass lesion, acute hemorrhage,  or hydrocephalus.   Impression: 1. No CT evidence of acute intracranial abnormality.  Electronically Signed By: Thamas Jaegers Electronically Signed On: 11/11/2022 8:37 AM    Neuropsychological Test Results Name: Dalton Kidd Age: 76 years Date Tested: 11/28/22  Test Results: - MMSE: 27/30 correct; You were alert and  generally oriented (i.e., you were  unsure of the county where this clinic is located), and you lost additional  points tasks assessing recall (2/3 correct) and copying. - Clock Drawing: Intact and Accurate - Cognitive Test Results o Impaired: Simple Attention, Naming, Writing Skills, Lexical Fluency, List  Learning, Scientist, research (life sciences)  o Intact: Working Memory*, Visual Scanning Speed, Visual Tracking Speed,  Graphomotor Speed*, Semantic Fluency, Oral  Production, Auditory  Comprehension, Visual Discrimination, List Memory, Visual Learning and  Memory, Verbal Abstract Reasoning, Visual Abstract Reasoning*, Mental  Flexibility - Psychological Considerations: Consistent with your report during the  clinical interview, there is evidence of mild depression (GDS, 10/30).   * Statistically within normal limits, but lower than expected  When examining your serial neuropsychological testing (2018 and 2024), there was  evidence of: - STABLE (AND INTACT) performance on measures of visual scanning speed,  visual tracking speed, semantic fluency, auditory comprehension, visual  discrimination, list memory, visual learning/memory, verbal abstract  reasoning, and mental flexibility - REDUCED performance on measures of simple attention span, lexical  fluency, and list learning. Although still within normal limits, your scores  DECLINED SLIGHTLY on measures of graphomotor speed and working memory.  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  This diagnosis is based on your medical and psychological history, report of  symptoms onset, and measured cognitive difficulties. The severity is mild due  to remaining independent in all activities of daily living at this time. Your  cognitive issues are likely exacerbated at times by your physical discomfort and  mild psychiatric distress.   Recommendations:  1. General compensatory strategies such as using lists and reminders,  structuring the day, and increasing routines may be helpful. Participation in  the [facility] group may be particularly helpful. 2. Given your reported decline in spelling and writing, and your measured  decline in multiple areas involving language skills, consultation with Speech  Language  Therapy may be helpful. You are also encouraged to follow up with  neurology for help with differential diagnosis and treatment. 3. Re-evaluation of your cognitive difficulties is recommended in 18 to 24  months. This will allow [place] to evaluate any progression of your symptoms and  to assist with treatment planning.  4. Continued mental health treatment is also strongly recommended. 5. It is also strongly recommended that you continue to work with your  medical team to address your physical symptoms (e.g., chronic pain) which can  cause distractibility and reduce cognitive efficiency. 6. You should continue to engage in leisure and social activities.  7. Activities and practices related to general health and wellbeing are  recommended, such as good sleep hygiene, regular exercise, eating a heart- healthy diet, and consuming alcohol and caffeine in moderation.  8. Information on [facility] ([facility] electronic record access for  patients) was provided. 9. If you find yourself in crisis, please call the Crisis Line phone number  ([phone], then press 1), go to the ER, or call emergency services.  For additional questions, please feel free to call Dr. Palma Holter at  [phone], x. [phone]    PATIENT REPORTED OUTCOME MEASURES (PROM): To be provided in first 2 sessions  TREATMENT DATE:  03/03/23: Pt needs PROM next session. He entered with homework completed with overall 85% success. SLP assisted pt with stimuli with errors. He req'd min-mod A usually with the responses. SLP had pt write three sentences using the target word if pt could not generate with min A from SLP. SLP had to shape pt's sentences (were all same sentence structure) initially but pt was independent afterwards. SLP provided homework and told pt to complete the same way - write sentences if unable to  generate word with only a little assistance from wife/family.   02/28/23: Given "s" statement SLP wonders if pt appropritate for OT evaluation. Pt to consider and ask about script PRN.  When SLP told pt that it did not appear his wife was very concerned about the differences he has noticed and has sought ST for pt stated, "Yeah, she's not concerned about much at all."  SLP completed CLQT today; SLP wonders pt's visual processing due to slow response time with visual memory, and difficulty with design generation. Scores are below: The Cognitive Linguistic Quick Test (CLQT) was administered to assess the relative status of five cognitive domains: attention, memory, language, executive functioning, and visuospatial skills. Scores from 10 tasks were used to estimate severity ratings (standardized for age groups 18-69 years and 70-89 years) for each domain, a clock drawing task, as well as an overall composite severity rating of cognition.      Task Score Criterion Cut Scores  Personal Facts 8/8 8  Symbol Cancellation 12/12 10  Confrontation Naming 10/10 10  Clock Drawing  12/13 11  Story Retelling 7/10 5  Symbol Trails 10/10 6  Generative Naming 5/9 4  Design Memory 2/6 4  Mazes  8/8 4  Design Generation 5/13 5    Cognitive Domain Composite Score Severity Rating  Attention 193/215 WNL  Memory 123/185 Mild  Executive Function 28/40 WNL  Language 30/37 Low-WNL  Visuospatial Skills 83/105 WNL  Clock Drawing  12/13 WNL  Composite Severity Rating  WNL     02/24/23: CLQT initiated. SLP interviewed pt's wife re: pt's deficit areas, who states that she thinks pt's memory and word retrieval skills are age-appropriate and do not "alarm" her. Pt uses calendar app for appointments, has auto-pay for bills, and reports no difficulty taking meds. SLP suggested wife ask pt about details from ST appointments and if she suspects pt's performance less than it should be, she should cont to attend ST and should  consider attending [facility].   02/21/23: N/A  PATIENT EDUCATION: Education details: Role of SLP in ST Person educated: Patient and Spouse Education method: Explanation Education comprehension: verbalized understanding and needs further education   GOALS: Goals reviewed with patient? No  SHORT TERM GOALS: Target date: 03/21/23  Pt will complete CLQT Baseline: Goal status: met  2.  Pt will name 10 items in salient category for pt in one minute Baseline:  Goal status: INITIAL  3.  Pt will tell SLP two memory strategies he could use Baseline:  Goal status: INITIAL  4. Pt will note 85% of written errors at the phrase level with compensations used PRN Baseline:  Goal status: INITIAL   LONG TERM GOALS: Target date: 04/18/23  Pt will use one memory strategy in a session x3 Baseline:  Goal status: INITIAL  2.  Pt will use anomia strategies in 10 minutes functional simple to mod complex conversation in 3 sessions Baseline:  Goal status: INITIAL  3.  Pt will  use a memory strategy in a session x5 Baseline:  Goal status: INITIAL  ASSESSMENT:  CLINICAL IMPRESSION: Patient is a 78 y.o. M who was seen today for assessment of cognitive communication; CLQT completed today. See "Treatment Date" for more details. Tarick underwent neuropsych evaluation which highlighted deficits in memory, abstract reasoning, semantic fluency, and attention. 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   OBJECTIVE IMPAIRMENTS: include attention, memory, and aphasia. These impairments are limiting patient from managing appointments, household responsibilities, ADLs/IADLs, and effectively communicating at home and in community. Factors affecting potential to achieve goals and functional outcome are ability to learn/carryover information. Patient will benefit from skilled SLP services to address above impairments and improve overall function.  REHAB POTENTIAL: Good  PLAN:  SLP  FREQUENCY: 2x/week  SLP DURATION: 8 weeks  PLANNED INTERVENTIONS: Language facilitation, Environmental controls, Cueing hierachy, Cognitive reorganization, Internal/external aids, Functional tasks, Multimodal communication approach, SLP instruction and feedback, Compensatory strategies, Patient/family education, and [id number] Treatment of speech (30 or 45 min)     Emelie Newsom, CCC-SLP 03/03/2023, 5:24 PM

## 2023-03-07 ENCOUNTER — Ambulatory Visit: Payer: Medicare HMO

## 2023-03-07 DIAGNOSIS — R41841 Cognitive communication deficit: Secondary | ICD-10-CM | POA: Diagnosis not present

## 2023-03-07 DIAGNOSIS — R4701 Aphasia: Secondary | ICD-10-CM

## 2023-03-07 NOTE — Patient Instructions (Signed)

## 2023-03-07 NOTE — Therapy (Unsigned)
OUTPATIENT SPEECH LANGUAGE PATHOLOGY TREATMENT   Patient Name: Dalton Kidd MRN: [id number] DOB:[birth year], 78 y.o., male Today's Date: 03/09/2023  [facility] REFERRING PROVIDER: Edison Nasuti, MD ([facility])  END OF SESSION:  End of Session - 03/09/23 2226     Visit Number 5    Number of Visits 17    Date for SLP Re-Evaluation 04/25/23    SLP Start Time 0934    SLP Stop Time  1015    SLP Time Calculation (min) 41 min    Activity Tolerance Patient tolerated treatment well              Past Medical History:  Diagnosis Date   Depression    GERD (gastroesophageal reflux disease)    Hyperlipidemia    Sleep apnea    Past Surgical History:  Procedure Laterality Date   ANKLE FRACTURE SURGERY Left    FEMUR SURGERY Left    THUMB ARTHROSCOPY Left    Patient Active Problem List   Diagnosis Date Noted   Hoarseness 11/06/2021   Mild persistent asthma without complication 03/09/2021   Diffuse lung disease 01/20/2021   Shortness of breath 01/15/2021   Nodule of upper lobe of right lung 11/14/2020   Abnormal CT of the chest 11/14/2020    ONSET DATE: "A year or two"   REFERRING DIAG: F80.1 Expressive language disorder  THERAPY DIAG:  Aphasia  Cognitive communication deficit  Rationale for Evaluation and Treatment: Rehabilitation  SUBJECTIVE:   SUBJECTIVE STATEMENT: "It seems to be better." (Speech) Pt accompanied by: self  PERTINENT HISTORY: Scant history regarding etiology or more details about pt's sx are available at this time. ([facility]) Pt states he has difficulty spelling words at this time, and his writing gets very small by the end of the sentence. [facility]22 for parasthesias and 11/29/22.  PAIN:  Are you having pain? No  FALLS: Has patient fallen in last 6 months?  Yes, Number of falls: 1, 11/10/22   PATIENT GOALS: "I used to be able to spell - didn't need any help."  OBJECTIVE:  Note: Objective measures were  completed at Evaluation unless otherwise noted.  DIAGNOSTIC FINDINGS:  Report Status: Verified Date Reported: Nov 11, 2022 Date Verified: Nov 11, 2022 Verifier E-Sig:/ES/P Vergara-Wentland MD  Report: CT HEAD WITHOUT CONTRAST  HISTORY: S/p fall and hit head last night. No loc  COMPARISON: Head CT scan 05/24/2022.   TECHNIQUE: Axial CT images of the brain from skull base to  vertex, including portions of the face and sinuses, were obtained  without contrast. Supplemental 2-D reformatted images were  generated and reviewed as needed.   FINDINGS:   Calvarium/skull base: No evidence of fracture or destructive  lesion. Mastoids and middle ear grossly clear.   Paranasal sinuses: Imaged portions clear.   Brain: No evidence of acute large territory of infarct. Mild  hypodensity involving the periventricular white matter is  nonspecific but probably consistent with chronic microvascular  ischemic changes. No mass effect, mass lesion, acute hemorrhage,  or hydrocephalus.   Impression: 1. No CT evidence of acute intracranial abnormality.  Electronically Signed By: Thamas Jaegers Electronically Signed On: 11/11/2022 8:37 AM    Neuropsychological Test Results Name: Dalton Kidd Age: 51 years Date Tested: 11/28/22  Test Results: - MMSE: 27/30 correct; You were alert and generally oriented (i.e., you were  unsure of the county where this clinic is located), and you lost additional  points tasks assessing recall (2/3 correct)  and copying. - Clock Drawing: Intact and Accurate - Cognitive Test Results o Impaired: Simple Attention, Naming, Writing Skills, Lexical Fluency, List  Learning, Scientist, research (life sciences)  o Intact: Working Memory*, Visual Scanning Speed, Visual Tracking Speed,  Graphomotor Speed*, Semantic Fluency, Oral Production, Auditory  Comprehension, Visual Discrimination, List Memory, Visual Learning and  Memory, Verbal Abstract Reasoning, Visual Abstract Reasoning*, Mental   Flexibility - Psychological Considerations: Consistent with your report during the  clinical interview, there is evidence of mild depression (GDS, 10/30).   * Statistically within normal limits, but lower than expected  When examining your serial neuropsychological testing (2018 and 2024), there was  evidence of: - STABLE (AND INTACT) performance on measures of visual scanning speed,  visual tracking speed, semantic fluency, auditory comprehension, visual  discrimination, list memory, visual learning/memory, verbal abstract  reasoning, and mental flexibility - REDUCED performance on measures of simple attention span, lexical  fluency, and list learning. Although still within normal limits, your scores  DECLINED SLIGHTLY on measures of graphomotor speed and working memory.  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  This diagnosis is based on your medical and psychological history, report of  symptoms onset, and measured cognitive difficulties. The severity is mild due  to remaining independent in all activities of daily living at this time. Your  cognitive issues are likely exacerbated at times by your physical discomfort and  mild psychiatric distress.   Recommendations:  1. General compensatory strategies such as using lists and reminders,  structuring the day, and increasing routines may be helpful. Participation in  the [facility] group may be particularly helpful. 2. Given your reported decline in spelling and writing, and your measured  decline in multiple areas involving language skills, consultation with Speech  Language Therapy may be helpful. You are also encouraged to follow up with  neurology for help with differential diagnosis and treatment. 3. Re-evaluation of your cognitive  difficulties is recommended in 18 to 24  months. This will allow [place] to evaluate any progression of your symptoms and  to assist with treatment planning.  4. Continued mental health treatment is also strongly recommended. 5. It is also strongly recommended that you continue to work with your  medical team to address your physical symptoms (e.g., chronic pain) which can  cause distractibility and reduce cognitive efficiency. 6. You should continue to engage in leisure and social activities.  7. Activities and practices related to general health and wellbeing are  recommended, such as good sleep hygiene, regular exercise, eating a heart- healthy diet, and consuming alcohol and caffeine in moderation.  8. Information on [facility] ([facility] electronic record access for  patients) was provided. 9. If you find yourself in crisis, please call the Crisis Line phone number  ([phone], then press 1), go to the ER, or call emergency services.  For additional questions, please feel free to call Dr. Palma Holter at  [phone], x. [phone]    PATIENT REPORTED OUTCOME MEASURES (PROM): Communication Effectiveness Survey: provided today with pt scoring 19/32 (lower numbers indicating less effective communication). Pt scored "3" with talking with family members at home, with a person in the car, and with a familiar person over the phone.  TREATMENT DATE:  03/07/23: No OT referral yet.  SLP educated pt about anomia strategies. Pt stated he is already using many of these.  Pt entered today with homework complete. He had more difficulty generating ideas (not necessarily the names) of items in two categories. PROM provided today with results above. Pt generated fils-ins for specific worded sentences with rare min A. Pt to cont this homework this weekend.  03/03/23: Pt needs PROM next  session. He entered with homework completed with overall 85% success. SLP assisted pt with stimuli with errors. He req'd min-mod A usually with the responses. SLP had pt write three sentences using the target word if pt could not generate with min A from SLP. SLP had to shape pt's sentences (were all same sentence structure) initially but pt was independent afterwards. SLP provided homework and told pt to complete the same way - write sentences if unable to generate word with only a little assistance from wife/family.   02/28/23: Given "s" statement SLP wonders if pt appropritate for OT evaluation. Pt to consider and ask about script PRN.  When SLP told pt that it did not appear his wife was very concerned about the differences he has noticed and has sought ST for pt stated, "Yeah, she's not concerned about much at all."  SLP completed CLQT today; SLP wonders pt's visual processing due to slow response time with visual memory, and difficulty with design generation. Scores are below: The Cognitive Linguistic Quick Test (CLQT) was administered to assess the relative status of five cognitive domains: attention, memory, language, executive functioning, and visuospatial skills. Scores from 10 tasks were used to estimate severity ratings (standardized for age groups 18-69 years and 70-89 years) for each domain, a clock drawing task, as well as an overall composite severity rating of cognition.      Task Score Criterion Cut Scores  Personal Facts 8/8 8  Symbol Cancellation 12/12 10  Confrontation Naming 10/10 10  Clock Drawing  12/13 11  Story Retelling 7/10 5  Symbol Trails 10/10 6  Generative Naming 5/9 4  Design Memory 2/6 4  Mazes  8/8 4  Design Generation 5/13 5    Cognitive Domain Composite Score Severity Rating  Attention 193/215 WNL  Memory 123/185 Mild  Executive Function 28/40 WNL  Language 30/37 Low-WNL  Visuospatial Skills 83/105 WNL  Clock Drawing  12/13 WNL  Composite Severity Rating   WNL     02/24/23: CLQT initiated. SLP interviewed pt's wife re: pt's deficit areas, who states that she thinks pt's memory and word retrieval skills are age-appropriate and do not "alarm" her. Pt uses calendar app for appointments, has auto-pay for bills, and reports no difficulty taking meds. SLP suggested wife ask pt about details from ST appointments and if she suspects pt's performance less than it should be, she should cont to attend ST and should consider attending [facility] appointments with [facility].   02/21/23: N/A  PATIENT EDUCATION: Education details: Role of SLP in ST Person educated: Patient and Spouse Education method: Explanation Education comprehension: verbalized understanding and needs further education   GOALS: Goals reviewed with patient? No  SHORT TERM GOALS: Target date: 03/21/23  Pt will complete CLQT Baseline: Goal status: met  2.  Pt will name 10 items in salient category for pt in one minute Baseline:  Goal status: INITIAL  3.  Pt will tell SLP two memory strategies he could use Baseline:  Goal status: INITIAL  4. Pt will note 85% of written  errors at the phrase level with compensations used PRN Baseline:  Goal status: INITIAL   LONG TERM GOALS: Target date: 04/18/23  Pt will use one memory strategy in a session x3 Baseline:  Goal status: INITIAL  2.  Pt will use anomia strategies in 10 minutes functional simple to mod complex conversation in 3 sessions Baseline:  Goal status: INITIAL  3.  Pt will use a memory strategy in a session x5 Baseline:  Goal status: INITIAL  ASSESSMENT:  CLINICAL IMPRESSION: Patient is a 78 y.o. M who was seen today for assessment of cognitive communication; See "Treatment Date" for more details. Tyra underwent neuropsych evaluation which highlighted deficits in memory, abstract reasoning, semantic fluency, and attention. 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   OBJECTIVE IMPAIRMENTS: include attention, memory, and  aphasia. These impairments are limiting patient from managing appointments, household responsibilities, ADLs/IADLs, and effectively communicating at home and in community. Factors affecting potential to achieve goals and functional outcome are ability to learn/carryover information. Patient will benefit from skilled SLP services to address above impairments and improve overall function.  REHAB POTENTIAL: Good  PLAN:  SLP FREQUENCY: 2x/week  SLP DURATION: 8 weeks  PLANNED INTERVENTIONS: Language facilitation, Environmental controls, Cueing hierachy, Cognitive reorganization, Internal/external aids, Functional tasks, Multimodal communication approach, SLP instruction and feedback, Compensatory strategies, Patient/family education, and 13244 Treatment of speech (30 or 45 min)     Argil Mahl, CCC-SLP 03/09/2023, 10:26 PM

## 2023-03-10 ENCOUNTER — Ambulatory Visit: Payer: Medicare HMO

## 2023-03-10 DIAGNOSIS — R4701 Aphasia: Secondary | ICD-10-CM

## 2023-03-10 DIAGNOSIS — R41841 Cognitive communication deficit: Secondary | ICD-10-CM | POA: Diagnosis not present

## 2023-03-10 NOTE — Patient Instructions (Signed)

## 2023-03-10 NOTE — Therapy (Signed)
OUTPATIENT SPEECH LANGUAGE PATHOLOGY TREATMENT   Patient Name: Dalton Kidd MRN: [id number] DOB:Jul 03, 1945, 78 y.o., male Today's Date: 03/10/2023  [facility] REFERRING PROVIDER: Edison Nasuti, MD ([facility]  END OF SESSION:  End of Session - 03/10/23 0949     Visit Number 6    Number of Visits 17    Date for SLP Re-Evaluation 04/25/23    SLP Start Time 0935    SLP Stop Time  1015    SLP Time Calculation (min) 40 min    Activity Tolerance Patient tolerated treatment well              Past Medical History:  Diagnosis Date   Depression    GERD (gastroesophageal reflux disease)    Hyperlipidemia    Sleep apnea    Past Surgical History:  Procedure Laterality Date   ANKLE FRACTURE SURGERY Left    FEMUR SURGERY Left    THUMB ARTHROSCOPY Left    Patient Active Problem List   Diagnosis Date Noted   Hoarseness 11/06/2021   Mild persistent asthma without complication 03/09/2021   Diffuse lung disease 01/20/2021   Shortness of breath 01/15/2021   Nodule of upper lobe of right lung 11/14/2020   Abnormal CT of the chest 11/14/2020    ONSET DATE: "A year or two"   REFERRING DIAG: F80.1 Expressive language disorder  THERAPY DIAG:  Cognitive communication deficit  Aphasia  Rationale for Evaluation and Treatment: Rehabilitation  SUBJECTIVE:   SUBJECTIVE STATEMENT: Pt entered with homework completed.  Pt accompanied by: self  PERTINENT HISTORY: Scant history regarding etiology or more details about pt's sx are available at this time. ([facility]) Pt states he has difficulty spelling words at this time, and his writing gets very small by the end of the sentence. [facility]22 for parasthesias and 11/29/22.  PAIN:  Are you having pain? No  FALLS: Has patient fallen in last 6 months?  Yes, Number of falls: 1, 11/10/22   PATIENT GOALS: "I used to be able to spell - didn't need any help."  OBJECTIVE:  Note: Objective measures were  completed at Evaluation unless otherwise noted.  DIAGNOSTIC FINDINGS:  Report Status: Verified Date Reported: Nov 11, 2022 Date Verified: Nov 11, 2022 Verifier E-Sig:/ES/P Vergara-Wentland MD  Report: CT HEAD WITHOUT CONTRAST  HISTORY: S/p fall and hit head last night. No loc  COMPARISON: Head CT scan 05/24/2022.   TECHNIQUE: Axial CT images of the brain from skull base to  vertex, including portions of the face and sinuses, were obtained  without contrast. Supplemental 2-D reformatted images were  generated and reviewed as needed.   FINDINGS:   Calvarium/skull base: No evidence of fracture or destructive  lesion. Mastoids and middle ear grossly clear.   Paranasal sinuses: Imaged portions clear.   Brain: No evidence of acute large territory of infarct. Mild  hypodensity involving the periventricular white matter is  nonspecific but probably consistent with chronic microvascular  ischemic changes. No mass effect, mass lesion, acute hemorrhage,  or hydrocephalus.   Impression: 1. No CT evidence of acute intracranial abnormality.  Electronically Signed By: Thamas Jaegers Electronically Signed On: 11/11/2022 8:37 AM    Neuropsychological Test Results Name: Alfreddie Consalvo Age: 69 years Date Tested: 11/28/22  Test Results: - MMSE: 27/30 correct; You were alert and generally oriented (i.e., you were  unsure of the county where this clinic is located), and you lost additional  points tasks assessing recall (2/3 correct)  and copying. - Clock Drawing: Intact and Accurate - Cognitive Test Results o Impaired: Simple Attention, Naming, Writing Skills, Lexical Fluency, List  Learning, Scientist, research (life sciences)  o Intact: Working Memory*, Visual Scanning Speed, Visual Tracking Speed,  Graphomotor Speed*, Semantic Fluency, Oral Production, Auditory  Comprehension, Visual Discrimination, List Memory, Visual Learning and  Memory, Verbal Abstract Reasoning, Visual Abstract Reasoning*, Mental   Flexibility - Psychological Considerations: Consistent with your report during the  clinical interview, there is evidence of mild depression (GDS, 10/30).   * Statistically within normal limits, but lower than expected  When examining your serial neuropsychological testing (2018 and 2024), there was  evidence of: - STABLE (AND INTACT) performance on measures of visual scanning speed,  visual tracking speed, semantic fluency, auditory comprehension, visual  discrimination, list memory, visual learning/memory, verbal abstract  reasoning, and mental flexibility - REDUCED performance on measures of simple attention span, lexical  fluency, and list learning. Although still within normal limits, your scores  DECLINED SLIGHTLY on measures of graphomotor speed and working memory.  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  This diagnosis is based on your medical and psychological history, report of  symptoms onset, and measured cognitive difficulties. The severity is mild due  to remaining independent in all activities of daily living at this time. Your  cognitive issues are likely exacerbated at times by your physical discomfort and  mild psychiatric distress.   Recommendations:  1. General compensatory strategies such as using lists and reminders,  structuring the day, and increasing routines may be helpful. Participation in  the [facility] group may be particularly helpful. 2. Given your reported decline in spelling and writing, and your measured  decline in multiple areas involving language skills, consultation with Speech  Language Therapy may be helpful. You are also encouraged to follow up with  neurology for help with differential diagnosis and treatment. 3. Re-evaluation of your cognitive  difficulties is recommended in 18 to 24  months. This will allow [place] to evaluate any progression of your symptoms and  to assist with treatment planning.  4. Continued mental health treatment is also strongly recommended. 5. It is also strongly recommended that you continue to work with your  medical team to address your physical symptoms (e.g., chronic pain) which can  cause distractibility and reduce cognitive efficiency. 6. You should continue to engage in leisure and social activities.  7. Activities and practices related to general health and wellbeing are  recommended, such as good sleep hygiene, regular exercise, eating a heart- healthy diet, and consuming alcohol and caffeine in moderation.  8. Information on [facility] ([facility] electronic record access for  patients) was provided. 9. If you find yourself in crisis, please call the Crisis Line phone number  ([phone], then press 1), go to the ER, or call emergency services.  For additional questions, please feel free to call Dr. Palma Holter at  [phone], [phone]. [phone]    PATIENT REPORTED OUTCOME MEASURES (PROM): Communication Effectiveness Survey: provided today with pt scoring 19/32 (lower numbers indicating less effective communication). Pt scored "3" with talking with family members at home, with a person in the car, and with a familiar person over the phone.  TREATMENT DATE:  03/10/23: No OT referral yet. Pt will put the message into "[facility]" and see what happens. He called again Friday after ST.  Pt states that his short term memory is different than it was however he is using strategy of association, and writing it down for the most assistance. SLP reviewed the memory strategies today with pt (see "pt instructions"). He has a system for his meds (once/day) and for his appointments. He uses billpay to  auto pay his bills for him. He and SLP talked about also using sticky notes PRN.  03/07/23: No OT referral yet.  SLP educated pt about anomia strategies. Pt stated he is already using many of these.  Pt entered today with homework complete. He had more difficulty generating ideas (not necessarily the names) of items in two categories. PROM provided today with results above. Pt generated fils-ins for specific worded sentences with rare min A. Pt to cont this homework this weekend.  03/03/23: Pt needs PROM next session. He entered with homework completed with overall 85% success. SLP assisted pt with stimuli with errors. He req'd min-mod A usually with the responses. SLP had pt write three sentences using the target word if pt could not generate with min A from SLP. SLP had to shape pt's sentences (were all same sentence structure) initially but pt was independent afterwards. SLP provided homework and told pt to complete the same way - write sentences if unable to generate word with only a little assistance from wife/family.   02/28/23: Given "s" statement SLP wonders if pt appropritate for OT evaluation. Pt to consider and ask about script PRN.  When SLP told pt that it did not appear his wife was very concerned about the differences he has noticed and has sought ST for pt stated, "Yeah, she's not concerned about much at all."  SLP completed CLQT today; SLP wonders pt's visual processing due to slow response time with visual memory, and difficulty with design generation. Scores are below: The Cognitive Linguistic Quick Test (CLQT) was administered to assess the relative status of five cognitive domains: attention, memory, language, executive functioning, and visuospatial skills. Scores from 10 tasks were used to estimate severity ratings (standardized for age groups 18-69 years and 70-89 years) for each domain, a clock drawing task, as well as an overall composite severity rating of cognition.      Task  Score Criterion Cut Scores  Personal Facts 8/8 8  Symbol Cancellation 12/12 10  Confrontation Naming 10/10 10  Clock Drawing  12/13 11  Story Retelling 7/10 5  Symbol Trails 10/10 6  Generative Naming 5/9 4  Design Memory 2/6 4  Mazes  8/8 4  Design Generation 5/13 5    Cognitive Domain Composite Score Severity Rating  Attention 193/215 WNL  Memory 123/185 Mild  Executive Function 28/40 WNL  Language 30/37 Low-WNL  Visuospatial Skills 83/105 WNL  Clock Drawing  12/13 WNL  Composite Severity Rating  WNL     02/24/23: CLQT initiated. SLP interviewed pt's wife re: pt's deficit areas, who states that she thinks pt's memory and word retrieval skills are age-appropriate and do not "alarm" her. Pt uses calendar app for appointments, has auto-pay for bills, and reports no difficulty taking meds. SLP suggested wife ask pt about details from ST appointments and if she suspects pt's performance less than it should be, she should cont to attend ST and should consider attending [facility] appointments with pt.   02/21/23: N/A  PATIENT EDUCATION:  Education details: Role of SLP in ST Person educated: Patient and Spouse Education method: Explanation Education comprehension: verbalized understanding and needs further education   GOALS: Goals reviewed with patient? No  SHORT TERM GOALS: Target date: 03/21/23  Pt will complete CLQT Baseline: Goal status: met  2.  Pt will name 10 items in salient category for pt in one minute Baseline:  Goal status: INITIAL  3.  Pt will tell SLP two memory strategies he could use Baseline:  Goal status: INITIAL  4. Pt will note 85% of written errors at the phrase level with compensations used PRN Baseline:  Goal status: INITIAL   LONG TERM GOALS: Target date: 04/18/23  Pt will use one memory strategy in a session x3 Baseline:  Goal status: INITIAL  2.  Pt will use anomia strategies in 10 minutes functional simple to mod complex conversation in 3  sessions Baseline:  Goal status: INITIAL  3.  Pt will use a memory strategy in a session x5 Baseline:  Goal status: INITIAL  ASSESSMENT:  CLINICAL IMPRESSION: Patient is a 78 y.o. M who was seen today for treatment of aphasia and cognitive communication; See "Treatment Date" for more details. Noha underwent neuropsych evaluation which highlighted deficits in memory, abstract reasoning, semantic fluency, and attention. 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   OBJECTIVE IMPAIRMENTS: include attention, memory, and aphasia. These impairments are limiting patient from managing appointments, household responsibilities, ADLs/IADLs, and effectively communicating at home and in community. Factors affecting potential to achieve goals and functional outcome are ability to learn/carryover information. Patient will benefit from skilled SLP services to address above impairments and improve overall function.  REHAB POTENTIAL: Good  PLAN:  SLP FREQUENCY: 2x/week  SLP DURATION: 8 weeks  PLANNED INTERVENTIONS: Language facilitation, Environmental controls, Cueing hierachy, Cognitive reorganization, Internal/external aids, Functional tasks, Multimodal communication approach, SLP instruction and feedback, Compensatory strategies, Patient/family education, and [id number] Treatment of speech (30 or 45 min)     Lyfe Monger, CCC-SLP 03/10/2023, 9:49 AM

## 2023-03-14 ENCOUNTER — Ambulatory Visit: Payer: Medicare HMO

## 2023-03-14 DIAGNOSIS — R4701 Aphasia: Secondary | ICD-10-CM | POA: Diagnosis not present

## 2023-03-14 DIAGNOSIS — R41841 Cognitive communication deficit: Secondary | ICD-10-CM | POA: Diagnosis not present

## 2023-03-14 NOTE — Therapy (Signed)
OUTPATIENT SPEECH LANGUAGE PATHOLOGY TREATMENT   Patient Name: Dalton Kidd MRN: [id number] DOB:1946-01-04, 78 y.o., male Today's Date: 03/14/2023  [facility] REFERRING PROVIDER: Edison Nasuti, MD ([facility])  END OF SESSION:  End of Session - 03/14/23 0954     Visit Number 7    Number of Visits 17    Date for SLP Re-Evaluation 04/25/23    SLP Start Time 0933    SLP Stop Time  1015    SLP Time Calculation (min) 42 min    Activity Tolerance Patient tolerated treatment well              Past Medical History:  Diagnosis Date   Depression    GERD (gastroesophageal reflux disease)    Hyperlipidemia    Sleep apnea    Past Surgical History:  Procedure Laterality Date   ANKLE FRACTURE SURGERY Left    FEMUR SURGERY Left    THUMB ARTHROSCOPY Left    Patient Active Problem List   Diagnosis Date Noted   Hoarseness 11/06/2021   Mild persistent asthma without complication 03/09/2021   Diffuse lung disease 01/20/2021   Shortness of breath 01/15/2021   Nodule of upper lobe of right lung 11/14/2020   Abnormal CT of the chest 11/14/2020    ONSET DATE: "A year or two"   REFERRING DIAG: F80.1 Expressive language disorder  THERAPY DIAG:  Aphasia  Cognitive communication deficit  Rationale for Evaluation and Treatment: Rehabilitation  SUBJECTIVE:   SUBJECTIVE STATEMENT: Pt entered with homework completed. He states he has seen improvement with both spelling and with less frequent episodes of anomia. Pt accompanied by: self  PERTINENT HISTORY: Scant history regarding etiology or more details about pt's sx are available at this time. ([facility]) Pt states he has difficulty spelling words at this time, and his writing gets very small by the end of the sentence. [facility]22 for parasthesias and 11/29/22.  PAIN:  Are you having pain? "No, not presently, but when I get up from sitting my lower back hurts."  FALLS: Has patient fallen in last 6  months?  Yes, Number of falls: 1, 11/10/22   PATIENT GOALS: "I used to be able to spell - didn't need any help."  OBJECTIVE:  Note: Objective measures were completed at Evaluation unless otherwise noted.  DIAGNOSTIC FINDINGS:  Report Status: Verified Date Reported: Nov 11, 2022 Date Verified: Nov 11, 2022 Verifier E-Sig:/ES/P Vergara-Wentland MD  Report: CT HEAD WITHOUT CONTRAST  HISTORY: S/p fall and hit head last night. No loc  COMPARISON: Head CT scan 05/24/2022.   TECHNIQUE: Axial CT images of the brain from skull base to  vertex, including portions of the face and sinuses, were obtained  without contrast. Supplemental 2-D reformatted images were  generated and reviewed as needed.   FINDINGS:   Calvarium/skull base: No evidence of fracture or destructive  lesion. Mastoids and middle ear grossly clear.   Paranasal sinuses: Imaged portions clear.   Brain: No evidence of acute large territory of infarct. Mild  hypodensity involving the periventricular white matter is  nonspecific but probably consistent with chronic microvascular  ischemic changes. No mass effect, mass lesion, acute hemorrhage,  or hydrocephalus.   Impression: 1. No CT evidence of acute intracranial abnormality.  Electronically Signed By: Thamas Jaegers Electronically Signed On: 11/11/2022 8:37 AM    Neuropsychological Test Results Name: Dalton Kidd Age: 52 years Date Tested: 11/28/22  Test Results: - MMSE: 27/30 correct; You were  alert and generally oriented (i.e., you were  unsure of the county where this clinic is located), and you lost additional  points tasks assessing recall (2/3 correct) and copying. - Clock Drawing: Intact and Accurate - Cognitive Test Results o Impaired: Simple Attention, Naming, Writing Skills, Lexical Fluency, List  Learning, Scientist, research (life sciences)  o Intact: Working Memory*, Visual Scanning Speed, Visual Tracking Speed,  Graphomotor Speed*, Semantic Fluency, Oral  Production, Auditory  Comprehension, Visual Discrimination, List Memory, Visual Learning and  Memory, Verbal Abstract Reasoning, Visual Abstract Reasoning*, Mental  Flexibility - Psychological Considerations: Consistent with your report during the  clinical interview, there is evidence of mild depression (GDS, 10/30).   * Statistically within normal limits, but lower than expected  When examining your serial neuropsychological testing (2018 and 2024), there was  evidence of: - STABLE (AND INTACT) performance on measures of visual scanning speed,  visual tracking speed, semantic fluency, auditory comprehension, visual  discrimination, list memory, visual learning/memory, verbal abstract  reasoning, and mental flexibility - REDUCED performance on measures of simple attention span, lexical  fluency, and list learning. Although still within normal limits, your scores  DECLINED SLIGHTLY on measures of graphomotor speed and working memory.  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  This diagnosis is based on your medical and psychological history, report of  symptoms onset, and measured cognitive difficulties. The severity is mild due  to remaining independent in all activities of daily living at this time. Your  cognitive issues are likely exacerbated at times by your physical discomfort and  mild psychiatric distress.   Recommendations:  1. General compensatory strategies such as using lists and reminders,  structuring the day, and increasing routines may be helpful. Participation in  the [facility] group may be particularly helpful. 2. Given your reported decline in spelling and writing, and your measured  decline in multiple areas involving language skills, consultation with Speech  Language  Therapy may be helpful. You are also encouraged to follow up with  neurology for help with differential diagnosis and treatment. 3. Re-evaluation of your cognitive difficulties is recommended in 18 to 24  months. This will allow [place] to evaluate any progression of your symptoms and  to assist with treatment planning.  4. Continued mental health treatment is also strongly recommended. 5. It is also strongly recommended that you continue to work with your  medical team to address your physical symptoms (e.g., chronic pain) which can  cause distractibility and reduce cognitive efficiency. 6. You should continue to engage in leisure and social activities.  7. Activities and practices related to general health and wellbeing are  recommended, such as good sleep hygiene, regular exercise, eating a heart- healthy diet, and consuming alcohol and caffeine in moderation.  8. Information on [facility] ([facility] electronic record access for  patients) was provided. 9. If you find yourself in crisis, please call the Crisis Line phone number  (988, then press 1), go to the ER, or call emergency services.  For additional questions, please feel free to call Dr. Palma Holter at  [phone], x. [phone]    PATIENT REPORTED OUTCOME MEASURES (PROM): Communication Effectiveness Survey: provided today with pt scoring 19/32 (lower numbers indicating less effective communication). Pt scored "3" with talking with family members at home, with a person in the car, and with a  familiar person over the phone.                                                                                                                            TREATMENT DATE:  03/14/23: Pt told SLP two strategies he could use for improving memory. Pt shared with SLP "(he uses) the memory strategies everyday." Adriane indicated these strategies have been helpful for him. Homework completed 100% success (adding sentences to stories, writing 4-6 sentence  stories for specific situations) for spelling.  He thought of 10 items in common categories with occasional min A for thinking of items (not the names of the items). Using SLP cues pt thought of 10 items in an average of 75 seconds. Average 6.25 items were thought of on his own.  03/10/23: No OT referral yet. Pt will put the message into "[facility]" and see what happens. He called again Friday after ST.  Pt states that his short term memory is different than it was however he is using strategy of association, and writing it down for the most assistance. SLP reviewed the memory strategies today with pt (see "pt instructions"). He has a system for his meds (once/day) and for his appointments. He uses billpay to auto pay his bills for him. He and SLP talked about also using sticky notes PRN.  03/07/23: No OT referral yet.  SLP educated pt about anomia strategies. Pt stated he is already using many of these.  Pt entered today with homework complete. He had more difficulty generating ideas (not necessarily the names) of items in two categories. PROM provided today with results above. Pt generated fils-ins for specific worded sentences with rare min A. Pt to cont this homework this weekend.  03/03/23: Pt needs PROM next session. He entered with homework completed with overall 85% success. SLP assisted pt with stimuli with errors. He req'd min-mod A usually with the responses. SLP had pt write three sentences using the target word if pt could not generate with min A from SLP. SLP had to shape pt's sentences (were all same sentence structure) initially but pt was independent afterwards. SLP provided homework and told pt to complete the same way - write sentences if unable to generate word with only a little assistance from wife/family.   02/28/23: Given "s" statement SLP wonders if pt appropritate for OT evaluation. Pt to consider and ask about script PRN.  When SLP told pt that it did not appear his wife was  very concerned about the differences he has noticed and has sought ST for pt stated, "Yeah, she's not concerned about much at all."  SLP completed CLQT today; SLP wonders pt's visual processing due to slow response time with visual memory, and difficulty with design generation. Scores are below: The Cognitive Linguistic Quick Test (CLQT) was administered to assess the relative status of five cognitive domains: attention, memory, language, executive functioning, and visuospatial skills. Scores from 10 tasks were used to estimate  severity ratings (standardized for age groups 18-69 years and 70-89 years) for each domain, a clock drawing task, as well as an overall composite severity rating of cognition.      Task Score Criterion Cut Scores  Personal Facts 8/8 8  Symbol Cancellation 12/12 10  Confrontation Naming 10/10 10  Clock Drawing  12/13 11  Story Retelling 7/10 5  Symbol Trails 10/10 6  Generative Naming 5/9 4  Design Memory 2/6 4  Mazes  8/8 4  Design Generation 5/13 5    Cognitive Domain Composite Score Severity Rating  Attention 193/215 WNL  Memory 123/185 Mild  Executive Function 28/40 WNL  Language 30/37 Low-WNL  Visuospatial Skills 83/105 WNL  Clock Drawing  12/13 WNL  Composite Severity Rating  WNL     02/24/23: CLQT initiated. SLP interviewed pt's wife re: pt's deficit areas, who states that she thinks pt's memory and word retrieval skills are age-appropriate and do not "alarm" her. Pt uses calendar app for appointments, has auto-pay for bills, and reports no difficulty taking meds. SLP suggested wife ask pt about details from ST appointments and if she suspects pt's performance less than it should be, she should cont to attend ST and should consider attending [facility] appointments with [facility].   02/21/23: N/A  PATIENT EDUCATION: Education details: Role of SLP in ST Person educated: Patient and Spouse Education method: Explanation Education comprehension: verbalized understanding  and needs further education   GOALS: Goals reviewed with patient? No  SHORT TERM GOALS: Target date: 03/21/23  Pt will complete CLQT Baseline: Goal status: met  2.  Pt will name 10 items in salient category for pt in one minute Baseline:  Goal status: INITIAL  3.  Pt will tell SLP two memory strategies he could use Baseline:  Goal status: Met  4. Pt will note 85% of written errors at the phrase level with compensations used PRN Baseline:  Goal status: Met   LONG TERM GOALS: Target date: 04/18/23  Pt will use one memory strategy in a session x3 Baseline:  Goal status: INITIAL  2.  Pt will use anomia strategies in 10 minutes functional simple to mod complex conversation in 3 sessions Baseline:  Goal status: INITIAL  3.  Pt will use a memory strategy in a session x5 Baseline:  Goal status: INITIAL  ASSESSMENT:  CLINICAL IMPRESSION: Patient is a 78 y.o. M who was seen today for treatment of aphasia and cognitive communication; See "Treatment Date" for more details. He reports having improved word finding and his spelling being better than at evaluation. He agreed he could decr to once/week due to progress. Dalton Kidd underwent neuropsych evaluation which highlighted deficits in memory, abstract reasoning, semantic fluency, and attention. 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   OBJECTIVE IMPAIRMENTS: include attention, memory, and aphasia. These impairments are limiting patient from managing appointments, household responsibilities, ADLs/IADLs, and effectively communicating at home and in community. Factors affecting potential to achieve goals and functional outcome are ability to learn/carryover information. Patient will benefit from skilled SLP services to address above impairments and improve overall function.  REHAB POTENTIAL: Good  PLAN:  SLP FREQUENCY: 1x/week  SLP DURATION: 8 weeks  PLANNED INTERVENTIONS: Language facilitation, Environmental controls,  Cueing hierachy, Cognitive reorganization, Internal/external aids, Functional tasks, Multimodal communication approach, SLP instruction and feedback, Compensatory strategies, Patient/family education, and [id number] Treatment of speech (30 or 45 min)     Tilak Oakley, CCC-SLP 03/14/2023, 9:55 AM

## 2023-03-17 ENCOUNTER — Ambulatory Visit: Payer: Medicare HMO

## 2023-03-19 ENCOUNTER — Ambulatory Visit: Payer: Medicare HMO | Attending: Internal Medicine

## 2023-03-19 DIAGNOSIS — R41841 Cognitive communication deficit: Secondary | ICD-10-CM | POA: Diagnosis not present

## 2023-03-19 DIAGNOSIS — R4701 Aphasia: Secondary | ICD-10-CM | POA: Diagnosis not present

## 2023-03-19 NOTE — Patient Instructions (Signed)

## 2023-03-19 NOTE — Therapy (Signed)
 OUTPATIENT SPEECH LANGUAGE PATHOLOGY TREATMENT/Progress Note   Patient Name: Dalton Kidd MRN: [id number] DOB:Sep 23, 1945, 78 y.o., male Today's Date: 03/19/2023  [facility] REFERRING PROVIDER: Virgia Cones, [facility] ([facility])  END OF SESSION:  End of Session - 03/19/23 1359     Visit Number 8    Number of Visits 17    Date for SLP Re-Evaluation 04/25/23    SLP Start Time 1403    SLP Stop Time  1445    SLP Time Calculation (min) 42 min    Activity Tolerance Patient tolerated treatment well              Past Medical History:  Diagnosis Date   Depression    GERD (gastroesophageal reflux disease)    Hyperlipidemia    Sleep apnea    Past Surgical History:  Procedure Laterality Date   ANKLE FRACTURE SURGERY Left    FEMUR SURGERY Left    THUMB ARTHROSCOPY Left    Patient Active Problem List   Diagnosis Date Noted   Hoarseness 11/06/2021   Mild persistent asthma without complication 03/09/2021   Diffuse lung disease 01/20/2021   Shortness of breath 01/15/2021   Nodule of upper lobe of right lung 11/14/2020   Abnormal CT of the chest 11/14/2020   Speech Therapy Progress Note  Dates of Reporting Period: 02/21/23 to present  Subjective Statement: Pt has been seen for 8 sessions targeting word finding and memory strategies  Objective: Dalton Kidd states that the memory strategies and the anomia compensations have been very helpful for him in the last few weeks. When I can't think of a word I put another word in it's place now.   Goal Update: See below  Plan: Pt requests occupational therapy evaluation due to poor legibility and dropping things all the time.  Reason Skilled Services are Required: Pt has not yet reached max potential.   ONSET DATE: A year or two   REFERRING DIAG: F80.1 Expressive language disorder  THERAPY DIAG:  Aphasia  Cognitive communication deficit  Rationale for Evaluation and Treatment: Rehabilitation  SUBJECTIVE:   SUBJECTIVE  STATEMENT: Pt again entered with homework completed. Improvement has cont'd with both spelling and ess-frequent episodes of anomia.  Pt accompanied by: self  PERTINENT HISTORY: Scant history regarding etiology or more details about pt's sx are available at this time. ([facility]) Pt states he has difficulty spelling words at this time, and his writing gets very small by the end of the sentence. [facility]22 for parasthesias and 11/29/22.  PAIN:  Are you having pain? No, not presently, but when I get up from sitting my lower back hurts.  FALLS: Has patient fallen in last 6 months?  Yes, Number of falls: 1, 11/10/22   PATIENT GOALS: I used to be able to spell - didn't need any help.  OBJECTIVE:  Note: Objective measures were completed at Evaluation unless otherwise noted.  DIAGNOSTIC FINDINGS:  Report Status: Verified Date Reported: Nov 11, 2022 Date Verified: Nov 11, 2022 Verifier E-Sig:/ES/P Vergara-Wentland MD  Report: CT HEAD WITHOUT CONTRAST  HISTORY: S/p fall and hit head last night. No loc  COMPARISON: Head CT scan 05/24/2022.   TECHNIQUE: Axial CT images of the brain from skull base to  vertex, including portions of the face and sinuses, were obtained  without contrast. Supplemental 2-D reformatted images were  generated and reviewed as needed.   FINDINGS:   Calvarium/skull base: No evidence of fracture or destructive  lesion.  Mastoids and middle ear grossly clear.   Paranasal sinuses: Imaged portions clear.   Brain: No evidence of acute large territory of infarct. Mild  hypodensity involving the periventricular white matter is  nonspecific but probably consistent with chronic microvascular  ischemic changes. No mass effect, mass lesion, acute hemorrhage,  or hydrocephalus.   Impression: 1. No CT evidence of acute intracranial abnormality.  Electronically Signed By: Vina Hikes Electronically Signed On: 11/11/2022 8:37  AM    Neuropsychological Test Results Name: Dalton Kidd Age: 67 years Date Tested: 11/28/22  Test Results: - MMSE: 27/30 correct; You were alert and generally oriented (i.e., you were  unsure of the county where this clinic is located), and you lost additional  points tasks assessing recall (2/3 correct) and copying. - Clock Drawing: Intact and Accurate - Cognitive Test Results o Impaired: Simple Attention, Naming, Writing Skills, Lexical Fluency, List  Learning, Scientist, Research (life Sciences)  o Intact: Working Memory*, Visual Scanning Speed, Visual Tracking Speed,  Graphomotor Speed*, Semantic Fluency, Oral Production, Auditory  Comprehension, Visual Discrimination, List Memory, Visual Learning and  Memory, Verbal Abstract Reasoning, Visual Abstract Reasoning*, Mental  Flexibility - Psychological Considerations: Consistent with your report during the  clinical interview, there is evidence of mild depression (GDS, 10/30).   * Statistically within normal limits, but lower than expected  When examining your serial neuropsychological testing (2018 and 2024), there was  evidence of: - STABLE (AND INTACT) performance on measures of visual scanning speed,  visual tracking speed, semantic fluency, auditory comprehension, visual  discrimination, list memory, visual learning/memory, verbal abstract  reasoning, and mental flexibility - REDUCED performance on measures of simple attention span, lexical  fluency, and list learning. Although still within normal limits, your scores  DECLINED SLIGHTLY on measures of graphomotor speed and working memory.  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  This diagnosis is based on your medical and psychological history, report of  symptoms onset, and measured cognitive  difficulties. The severity is mild due  to remaining independent in all activities of daily living at this time. Your  cognitive issues are likely exacerbated at times by your physical discomfort and  mild psychiatric distress.   Recommendations:  1. General compensatory strategies such as using lists and reminders,  structuring the day, and increasing routines may be helpful. Participation in  the [facility] group may be particularly helpful. 2. Given your reported decline in spelling and writing, and your measured  decline in multiple areas involving language skills, consultation with Speech  Language Therapy may be helpful. You are also encouraged to follow up with  neurology for help with differential diagnosis and treatment. 3. Re-evaluation of your cognitive difficulties is recommended in 18 to 24  months. This will allow us  to evaluate any progression of your symptoms and  to assist with treatment planning.  4. Continued mental health treatment is also strongly recommended. 5. It is also strongly recommended that you continue to work with your  medical team to address your physical symptoms (e.g., chronic pain) which can  cause distractibility and reduce cognitive efficiency. 6. You should continue to engage in leisure and social activities.  7. Activities and practices related to general health and wellbeing are  recommended, such as good sleep hygiene, regular exercise, eating a heart- healthy diet, and consuming alcohol and caffeine in moderation.  8. Information on [facility] ([facility] electronic record access for  patients) was provided. 9. If you find yourself in crisis, please call the Crisis Line phone number  ([phone], then press 1), go to the ER, or call emergency services.  For additional questions, please feel free to call Dr. Dorothyann Bosch at  [phone], x. [phone]    PATIENT REPORTED OUTCOME MEASURES (PROM): Communication Effectiveness Survey: provided today  with pt scoring 19/32 (lower numbers indicating less effective communication). Pt scored 3 with talking with family members at home, with a person in the car, and with a familiar person over the phone.                                                                                                                            TREATMENT DATE:  03/19/23: Pt and SLP to talk next session about possibility of pt reducing to once every other week. Pt completed 95% of all homework - did not have 2 responsive naming items and 3 part/whole completed. 98% correct overall. Pt cont to tell SLP that he is pleased overall with his improved word finding but is still frustrated with his memory. Due to this SLP reviewed memory strategies with pt. [place] told SLP 2 examples of decr'd shot term memrory, and SLP suggested a memory strategy that would work with each of his examples.   03/14/23: Pt told SLP two strategies he could use for improving memory. Pt shared with SLP (he uses) the memory strategies everyday. Brack indicated these strategies have been helpful for him. Homework completed 100% success (adding sentences to stories, writing 4-6 sentence stories for specific situations) for spelling.  He thought of 10 items in common categories with occasional min A for thinking of items (not the names of the items). Using SLP cues pt thought of 10 items in an average of 75 seconds. Average 6.25 items were thought of on his own.  03/10/23: No OT referral yet. Pt will put the message into [facility] and see what happens. He called again Friday after ST.  Pt states that his short term memory is different than it was however he is using strategy of association, and writing it down for the most assistance. SLP reviewed the memory strategies today with pt (see pt instructions). He has a system for his meds (once/day) and for his appointments. He uses billpay to auto pay his bills for him. He and SLP talked about also using  sticky notes PRN.  03/07/23: No OT referral yet.  SLP educated pt about anomia strategies. Pt stated he is already using many of these.  Pt entered today with homework complete. He had more difficulty generating ideas (not necessarily the names) of items in two categories. PROM provided today with results above. Pt generated fils-ins for specific worded sentences with rare min A. Pt to cont this homework this weekend.  03/03/23: Pt needs PROM next session. He entered with homework completed with overall  85% success. SLP assisted pt with stimuli with errors. He req'd min-mod A usually with the responses. SLP had pt write three sentences using the target word if pt could not generate with min A from SLP. SLP had to shape pt's sentences (were all same sentence structure) initially but pt was independent afterwards. SLP provided homework and told pt to complete the same way - write sentences if unable to generate word with only a little assistance from wife/family.   02/28/23: Given s statement SLP wonders if pt appropritate for OT evaluation. Pt to consider and ask about script PRN.  When SLP told pt that it did not appear his wife was very concerned about the differences he has noticed and has sought ST for pt stated, Yeah, she's not concerned about much at all.  SLP completed CLQT today; SLP wonders pt's visual processing due to slow response time with visual memory, and difficulty with design generation. Scores are below: The Cognitive Linguistic Quick Test (CLQT) was administered to assess the relative status of five cognitive domains: attention, memory, language, executive functioning, and visuospatial skills. Scores from 10 tasks were used to estimate severity ratings (standardized for age groups 18-69 years and 70-89 years) for each domain, a clock drawing task, as well as an overall composite severity rating of cognition.      Task Score Criterion Cut Scores  Personal Facts 8/8 8  Symbol  Cancellation 12/12 10  Confrontation Naming 10/10 10  Clock Drawing  12/13 11  Story Retelling 7/10 5  Symbol Trails 10/10 6  Generative Naming 5/9 4  Design Memory 2/6 4  Mazes  8/8 4  Design Generation 5/13 5    Cognitive Domain Composite Score Severity Rating  Attention 193/215 WNL  Memory 123/185 Mild  Executive Function 28/40 WNL  Language 30/37 Low-WNL  Visuospatial Skills 83/105 WNL  Clock Drawing  12/13 WNL  Composite Severity Rating  WNL     02/24/23: CLQT initiated. SLP interviewed pt's wife re: pt's deficit areas, who states that she thinks pt's memory and word retrieval skills are age-appropriate and do not alarm her. Pt uses calendar app for appointments, has auto-pay for bills, and reports no difficulty taking meds. SLP suggested wife ask pt about details from ST appointments and if she suspects pt's performance less than it should be, she should cont to attend ST and should consider attending [facility].   02/21/23: N/A  PATIENT EDUCATION: Education details: See todays treatment Person educated: Patient Education method: Explanation Education comprehension: verbalized understanding and returned demonstration   GOALS: Goals reviewed with patient? No  SHORT TERM GOALS: Target date: 03/21/23  Pt will complete CLQT Baseline: Goal status: met  2.  Pt will name 10 items in salient category for pt in one minute Baseline:  Goal status: partially met   3.  Pt will tell SLP two memory strategies he could use Baseline:  Goal status: Met  4. Pt will note 85% of written errors at the phrase level with compensations used PRN Baseline:  Goal status: Met   LONG TERM GOALS: Target date: 04/18/23  Pt will use one memory strategy in a session x3 Baseline:  Goal status: INITIAL  2.  Pt will use anomia strategies in 10 minutes functional simple to mod complex conversation in 3 sessions Baseline:  Goal status: INITIAL  3.  Pt will use a memory  strategy in a session x5 Baseline:  Goal status: INITIAL  ASSESSMENT:  CLINICAL IMPRESSION: Patient  is a 78 y.o. M who was seen today for treatment of aphasia and cognitive communication; See Treatment Date for more details. He reports having improved word finding and his spelling being better than at evaluation. He agreed he could decr to once/week due to progress. [place] underwent neuropsych evaluation which highlighted deficits in memory, abstract reasoning, semantic fluency, and attention. 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   OBJECTIVE IMPAIRMENTS: include attention, memory, and aphasia. These impairments are limiting patient from managing appointments, household responsibilities, ADLs/IADLs, and effectively communicating at home and in community. Factors affecting potential to achieve goals and functional outcome are ability to learn/carryover information. Patient will benefit from skilled SLP services to address above impairments and improve overall function.  REHAB POTENTIAL: Good  PLAN:  SLP FREQUENCY: 1x/week  SLP DURATION: 8 weeks  PLANNED INTERVENTIONS: Language facilitation, Environmental controls, Cueing hierachy, Cognitive reorganization, Internal/external aids, Functional tasks, Multimodal communication approach, SLP instruction and feedback, Compensatory strategies, Patient/family education, and [id number] Treatment of speech (30 or 45 min)     Wilbur Labuda, CCC-SLP 03/19/2023, 1:59 PM

## 2023-03-26 ENCOUNTER — Ambulatory Visit: Payer: Medicare HMO

## 2023-03-26 DIAGNOSIS — R41841 Cognitive communication deficit: Secondary | ICD-10-CM | POA: Diagnosis not present

## 2023-03-26 DIAGNOSIS — R4701 Aphasia: Secondary | ICD-10-CM | POA: Diagnosis not present

## 2023-03-26 NOTE — Therapy (Signed)
OUTPATIENT SPEECH LANGUAGE PATHOLOGY TREATMENT/Progress Note   Patient Name: Dalton Kidd MRN: [id number] DOB:[birth year], 78 y.o., male Today's Date: 03/26/2023  [facility] REFERRING PROVIDER: Edison Nasuti, MD ([facility]  END OF SESSION:     Past Medical History:  Diagnosis Date   Depression    GERD (gastroesophageal reflux disease)    Hyperlipidemia    Sleep apnea    Past Surgical History:  Procedure Laterality Date   ANKLE FRACTURE SURGERY Left    FEMUR SURGERY Left    THUMB ARTHROSCOPY Left    Patient Active Problem List   Diagnosis Date Noted   Hoarseness 11/06/2021   Mild persistent asthma without complication 03/09/2021   Diffuse lung disease 01/20/2021   Shortness of breath 01/15/2021   Nodule of upper lobe of right lung 11/14/2020   Abnormal CT of the chest 11/14/2020   Speech Therapy Progress Note  Dates of Reporting Period: 02/21/23 to present  Subjective Statement: Pt has been seen for 8 sessions targeting word finding and memory strategies  Objective: Dalton Kidd states that the memory strategies and the anomia compensations have been very helpful for him in the last few weeks. "When I can't think of a word I put another word in it's place now."   Goal Update: See below  Plan: Pt requests occupational therapy evaluation due to poor legibility and "dropping things all the time."  Reason Skilled Services are Required: Pt has not yet reached max potential.   ONSET DATE: "A year or two"   REFERRING DIAG: F80.1 Expressive language disorder  THERAPY DIAG:  No diagnosis found.  Rationale for Evaluation and Treatment: Rehabilitation  SUBJECTIVE:   SUBJECTIVE STATEMENT: Pt again entered with homework completed. Improvement has cont'd with both spelling and ess-frequent episodes of anomia.  Pt accompanied by: self  PERTINENT HISTORY: Scant history regarding etiology or more details about pt's sx are available at this time. ([facility] referral) Pt states he has  difficulty spelling words at this time, and his writing gets very small by the end of the sentence. [facility]/22 for parasthesias and 11/29/22.  PAIN:  Are you having pain? "No, not presently, but when I get up from sitting my lower back hurts."  FALLS: Has patient fallen in last 6 months?  Yes, Number of falls: 1, 11/10/22   PATIENT GOALS: "I used to be able to spell - didn't need any help."  OBJECTIVE:  Note: Objective measures were completed at Evaluation unless otherwise noted.  DIAGNOSTIC FINDINGS:  Report Status: Verified Date Reported: Nov 11, 2022 Date Verified: Nov 11, 2022 Verifier E-Sig:/ES/P Vergara-Wentland MD  Report: CT HEAD WITHOUT CONTRAST  HISTORY: S/p fall and hit head last night. No loc  COMPARISON: Head CT scan 05/24/2022.   TECHNIQUE: Axial CT images of the brain from skull base to  vertex, including portions of the face and sinuses, were obtained  without contrast. Supplemental 2-D reformatted images were  generated and reviewed as needed.   FINDINGS:   Calvarium/skull base: No evidence of fracture or destructive  lesion. Mastoids and middle ear grossly clear.   Paranasal sinuses: Imaged portions clear.   Brain: No evidence of acute large territory of infarct. Mild  hypodensity involving the periventricular white matter is  nonspecific but probably consistent with chronic microvascular  ischemic changes. No mass effect, mass lesion, acute hemorrhage,  or hydrocephalus.   Impression: 1. No CT evidence of acute intracranial abnormality.  Electronically Signed By: Thamas Jaegers Electronically Signed  On: 11/11/2022 8:37 AM    Neuropsychological Test Results Name: Dalton Kidd Age: 50 years Date Tested: 11/28/22  Test Results: - MMSE: 27/30 correct; You were alert and generally oriented (i.e., you were  unsure of the county where this clinic is located), and you lost additional  points tasks assessing recall  (2/3 correct) and copying. - Clock Drawing: Intact and Accurate - Cognitive Test Results o Impaired: Simple Attention, Naming, Writing Skills, Lexical Fluency, List  Learning, Scientist, research (life sciences)  o Intact: Working Memory*, Visual Scanning Speed, Visual Tracking Speed,  Graphomotor Speed*, Semantic Fluency, Oral Production, Auditory  Comprehension, Visual Discrimination, List Memory, Visual Learning and  Memory, Verbal Abstract Reasoning, Visual Abstract Reasoning*, Mental  Flexibility - Psychological Considerations: Consistent with your report during the  clinical interview, there is evidence of mild depression (GDS, 10/30).   * Statistically within normal limits, but lower than expected  When examining your serial neuropsychological testing (2018 and 2024), there was  evidence of: - STABLE (AND INTACT) performance on measures of visual scanning speed,  visual tracking speed, semantic fluency, auditory comprehension, visual  discrimination, list memory, visual learning/memory, verbal abstract  reasoning, and mental flexibility - REDUCED performance on measures of simple attention span, lexical  fluency, and list learning. Although still within normal limits, your scores  DECLINED SLIGHTLY on measures of graphomotor speed and working memory.  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  This diagnosis is based on your medical and psychological history, report of  symptoms onset, and measured cognitive difficulties. The severity is mild due  to remaining independent in all activities of daily living at this time. Your  cognitive issues are likely exacerbated at times by your physical discomfort and  mild psychiatric distress.   Recommendations:  1. General compensatory strategies such as using lists  and reminders,  structuring the day, and increasing routines may be helpful. Participation in  the [facility] group may be particularly helpful. 2. Given your reported decline in spelling and writing, and your measured  decline in multiple areas involving language skills, consultation with Speech  Language Therapy may be helpful. You are also encouraged to follow up with  neurology for help with differential diagnosis and treatment. 3. Re-evaluation of your cognitive difficulties is recommended in 18 to 24  months. This will allow [place] to evaluate any progression of your symptoms and  to assist with treatment planning.  4. Continued mental health treatment is also strongly recommended. 5. It is also strongly recommended that you continue to work with your  medical team to address your physical symptoms (e.g., chronic pain) which can  cause distractibility and reduce cognitive efficiency. 6. You should continue to engage in leisure and social activities.  7. Activities and practices related to general health and wellbeing are  recommended, such as good sleep hygiene, regular exercise, eating a heart- healthy diet, and consuming alcohol and caffeine in moderation.  8. Information on [facility] ([facility] electronic record access for  patients) was provided. 9. If you find yourself in crisis, please call the Crisis Line phone number  ([phone], then press 1), go to the ER, or call emergency services.  For additional questions, please feel free to call Dr. Palma Holter at  [phone], x. [phone]    PATIENT REPORTED OUTCOME MEASURES (PROM): Communication Effectiveness Survey: provided today with  pt scoring 19/32 (lower numbers indicating less effective communication). Pt scored "3" with talking with family members at home, with a person in the car, and with a familiar person over the phone.                                                                                                                             TREATMENT DATE:  03/26/23: Pt still is disappointed about his memory - gives an example of being at a birthday party with lots of extended family and "someone who called my by my nickname came up and I didn't know them from [place]". SLP suggested pt ask the person their name so he could use repetition or write it down strategies, but pt stated he was uncomfortable asking this person their name. SLP reiterated he could have used that information to recall the person's name for next time but he told SLP he was still uncomfortable. Pt says that he is using alarms/reminders for appointments, calendar for people's birthdays, and an alarm for his medication times. Pt asked SLP if his crossword game on his phone was appropriate for him to use for word finding. SLP assessed with pt and told him it looked good for word finding, but SLP suggested use of SFA and educated pt about this today. SLP and pt to go over this next session  03/19/23: Pt and SLP to talk next session about possibility of pt reducing to once every other week. Pt completed 95% of all homework - did not have 2 responsive naming items and 3 part/whole completed. 98% correct overall. Pt cont to tell SLP that he is pleased overall with his improved word finding but is still frustrated with his memory. Due to this SLP reviewed memory strategies with pt. Fayrene Fearing told SLP 2 examples of decr'd shot term memrory, and SLP suggested a memory strategy that would work with each of his examples.   03/14/23: Pt told SLP two strategies he could use for improving memory. Pt shared with SLP "(he uses) the memory strategies everyday." Ramiel indicated these strategies have been helpful for him. Homework completed 100% success (adding sentences to stories, writing 4-6 sentence stories for specific situations) for spelling.  He thought of 10 items in common categories with occasional min A for thinking of items (not the names of the items). Using SLP cues pt  thought of 10 items in an average of 75 seconds. Average 6.25 items were thought of on his own.  03/10/23: No OT referral yet. Pt will put the message into "[facility]" and see what happens. He called again Friday after ST.  Pt states that his short term memory is different than it was however he is using strategy of association, and writing it down for the most assistance. SLP reviewed the memory strategies today with pt (see "pt instructions"). He has a system for his meds (once/day) and for his appointments. He uses billpay to auto pay his bills  for him. He and SLP talked about also using sticky notes PRN.  03/07/23: No OT referral yet.  SLP educated pt about anomia strategies. Pt stated he is already using many of these.  Pt entered today with homework complete. He had more difficulty generating ideas (not necessarily the names) of items in two categories. PROM provided today with results above. Pt generated fils-ins for specific worded sentences with rare min A. Pt to cont this homework this weekend.  03/03/23: Pt needs PROM next session. He entered with homework completed with overall 85% success. SLP assisted pt with stimuli with errors. He req'd min-mod A usually with the responses. SLP had pt write three sentences using the target word if pt could not generate with min A from SLP. SLP had to shape pt's sentences (were all same sentence structure) initially but pt was independent afterwards. SLP provided homework and told pt to complete the same way - write sentences if unable to generate word with only a little assistance from wife/family.   02/28/23: Given "s" statement SLP wonders if pt appropritate for OT evaluation. Pt to consider and ask about script PRN.  When SLP told pt that it did not appear his wife was very concerned about the differences he has noticed and has sought ST for pt stated, "Yeah, she's not concerned about much at all."  SLP completed CLQT today; SLP wonders pt's visual  processing due to slow response time with visual memory, and difficulty with design generation. Scores are below: The Cognitive Linguistic Quick Test (CLQT) was administered to assess the relative status of five cognitive domains: attention, memory, language, executive functioning, and visuospatial skills. Scores from 10 tasks were used to estimate severity ratings (standardized for age groups 18-69 years and 70-89 years) for each domain, a clock drawing task, as well as an overall composite severity rating of cognition.      Task Score Criterion Cut Scores  Personal Facts 8/8 8  Symbol Cancellation 12/12 10  Confrontation Naming 10/10 10  Clock Drawing  12/13 11  Story Retelling 7/10 5  Symbol Trails 10/10 6  Generative Naming 5/9 4  Design Memory 2/6 4  Mazes  8/8 4  Design Generation 5/13 5    Cognitive Domain Composite Score Severity Rating  Attention 193/215 WNL  Memory 123/185 Mild  Executive Function 28/40 WNL  Language 30/37 Low-WNL  Visuospatial Skills 83/105 WNL  Clock Drawing  12/13 WNL  Composite Severity Rating  WNL     02/24/23: CLQT initiated. SLP interviewed pt's wife re: pt's deficit areas, who states that she thinks pt's memory and word retrieval skills are age-appropriate and do not "alarm" her. Pt uses calendar app for appointments, has auto-pay for bills, and reports no difficulty taking meds. SLP suggested wife ask pt about details from ST appointments and if she suspects pt's performance less than it should be, she should cont to attend ST and should consider attending [facility].   02/21/23: N/A  PATIENT EDUCATION: Education details: See "todays treatment" Person educated: Patient Education method: Explanation Education comprehension: verbalized understanding and returned demonstration   GOALS: Goals reviewed with patient? No  SHORT TERM GOALS: Target date: 03/21/23  Pt will complete CLQT Baseline: Goal status: met  2.  Pt will name 10  items in salient category for pt in one minute Baseline:  Goal status: partially met   3.  Pt will tell SLP two memory strategies he could use Baseline:  Goal status: Met  4. Pt will note 85% of written errors at the phrase level with compensations used PRN Baseline:  Goal status: Met   LONG TERM GOALS: Target date: 04/18/23  Pt will use one memory strategy in a session x3 Baseline: 03/26/23 Goal status: INITIAL  2.  Pt will use anomia strategies in 10 minutes functional simple to mod complex conversation in 3 sessions Baseline:  Goal status: INITIAL   ASSESSMENT:  CLINICAL IMPRESSION: SLP deleted LTG #3 as it duplicated LTG #1. Patient is a 78 y.o. M who was seen today for treatment of aphasia and cognitive communication; See "Treatment Date" for more details. He continues to report having improved word finding and his spelling being better than at evaluation. ames underwent neuropsych evaluation which highlighted deficits in memory, abstract reasoning, semantic fluency, and attention. 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   OBJECTIVE IMPAIRMENTS: include attention, memory, and aphasia. These impairments are limiting patient from managing appointments, household responsibilities, ADLs/IADLs, and effectively communicating at home and in community. Factors affecting potential to achieve goals and functional outcome are ability to learn/carryover information. Patient will benefit from skilled SLP services to address above impairments and improve overall function.  REHAB POTENTIAL: Good  PLAN:  SLP FREQUENCY: 1x/week  SLP DURATION: 8 weeks  PLANNED INTERVENTIONS: Language facilitation, Environmental controls, Cueing hierachy, Cognitive reorganization, Internal/external aids, Functional tasks, Multimodal communication approach, SLP instruction and feedback, Compensatory strategies, Patient/family education, and [id number] Treatment of speech (30 or 45 min)      Fatmata Legere, CCC-SLP 03/26/2023, 9:41 AM

## 2023-03-31 ENCOUNTER — Ambulatory Visit: Payer: Medicare HMO

## 2023-03-31 DIAGNOSIS — R41841 Cognitive communication deficit: Secondary | ICD-10-CM | POA: Diagnosis not present

## 2023-03-31 DIAGNOSIS — R4701 Aphasia: Secondary | ICD-10-CM | POA: Diagnosis not present

## 2023-03-31 NOTE — Therapy (Signed)
OUTPATIENT SPEECH LANGUAGE PATHOLOGY TREATMENT   Patient Name: Dalton Kidd MRN: [id number] DOB:Nov 18, 1945, 78 y.o., male Today's Date: 03/31/2023  [facility] REFERRING PROVIDER: Edison Nasuti, [facility] ([facility]  END OF SESSION:  End of Session - 03/31/23 1013     Visit Number 10    Number of Visits 17    Date for SLP Re-Evaluation 04/25/23    SLP Start Time 0935    SLP Stop Time  1010    SLP Time Calculation (min) 35 min    Activity Tolerance Patient tolerated treatment well               Past Medical History:  Diagnosis Date   Depression    GERD (gastroesophageal reflux disease)    Hyperlipidemia    Sleep apnea    Past Surgical History:  Procedure Laterality Date   ANKLE FRACTURE SURGERY Left    FEMUR SURGERY Left    THUMB ARTHROSCOPY Left    Patient Active Problem List   Diagnosis Date Noted   Hoarseness 11/06/2021   Mild persistent asthma without complication 03/09/2021   Diffuse lung disease 01/20/2021   Shortness of breath 01/15/2021   Nodule of upper lobe of right lung 11/14/2020   Abnormal CT of the chest 11/14/2020     ONSET DATE: "A year or two"   REFERRING DIAG: F80.1 Expressive language disorder  THERAPY DIAG:  Aphasia  Cognitive communication deficit  Rationale for Evaluation and Treatment: Rehabilitation  SUBJECTIVE:   SUBJECTIVE STATEMENT: Pt again entered with homework completed. Improvement has cont'd with both spelling and ess-frequent episodes of anomia.  Pt accompanied by: self  PERTINENT HISTORY: Scant history regarding etiology or more details about pt's sx are available at this time. ([facility] referral) Pt states he has difficulty spelling words at this time, and his writing gets very small by the end of the sentence. [facility]22 for parasthesias and 11/29/22.  PAIN:  Are you having pain? "No, not presently, but when I get up from sitting my lower back hurts."  FALLS: Has patient fallen in last 6  months?  Yes, Number of falls: 1, 11/10/22   PATIENT GOALS: "I used to be able to spell - didn't need any help."  OBJECTIVE:  Note: Objective measures were completed at Evaluation unless otherwise noted.  DIAGNOSTIC FINDINGS:  Report Status: Verified Date Reported: Nov 11, 2022 Date Verified: Nov 11, 2022 Verifier E-Sig:/ES/P Vergara-Wentland MD  Report: CT HEAD WITHOUT CONTRAST  HISTORY: S/p fall and hit head last night. No loc  COMPARISON: Head CT scan 05/24/2022.   TECHNIQUE: Axial CT images of the brain from skull base to  vertex, including portions of the face and sinuses, were obtained  without contrast. Supplemental 2-D reformatted images were  generated and reviewed as needed.   FINDINGS:   Calvarium/skull base: No evidence of fracture or destructive  lesion. Mastoids and middle ear grossly clear.   Paranasal sinuses: Imaged portions clear.   Brain: No evidence of acute large territory of infarct. Mild  hypodensity involving the periventricular white matter is  nonspecific but probably consistent with chronic microvascular  ischemic changes. No mass effect, mass lesion, acute hemorrhage,  or hydrocephalus.   Impression: 1. No CT evidence of acute intracranial abnormality.  Electronically Signed By: Thamas Jaegers Electronically Signed On: 11/11/2022 8:37 AM    Neuropsychological Test Results Name: Dalton Kidd Age: 60 years Date Tested: 11/28/22  Test Results: - MMSE: 27/30 correct; You were alert  and generally oriented (i.e., you were  unsure of the county where this clinic is located), and you lost additional  points tasks assessing recall (2/3 correct) and copying. - Clock Drawing: Intact and Accurate - Cognitive Test Results o Impaired: Simple Attention, Naming, Writing Skills, Lexical Fluency, List  Learning, Scientist, research (life sciences)  o Intact: Working Memory*, Visual Scanning Speed, Visual Tracking Speed,  Graphomotor Speed*, Semantic Fluency, Oral  Production, Auditory  Comprehension, Visual Discrimination, List Memory, Visual Learning and  Memory, Verbal Abstract Reasoning, Visual Abstract Reasoning*, Mental  Flexibility - Psychological Considerations: Consistent with your report during the  clinical interview, there is evidence of mild depression (GDS, 10/30).   * Statistically within normal limits, but lower than expected  When examining your serial neuropsychological testing (2018 and 2024), there was  evidence of: - STABLE (AND INTACT) performance on measures of visual scanning speed,  visual tracking speed, semantic fluency, auditory comprehension, visual  discrimination, list memory, visual learning/memory, verbal abstract  reasoning, and mental flexibility - REDUCED performance on measures of simple attention span, lexical  fluency, and list learning. Although still within normal limits, your scores  DECLINED SLIGHTLY on measures of graphomotor speed and working memory.  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  This diagnosis is based on your medical and psychological history, report of  symptoms onset, and measured cognitive difficulties. The severity is mild due  to remaining independent in all activities of daily living at this time. Your  cognitive issues are likely exacerbated at times by your physical discomfort and  mild psychiatric distress.   Recommendations:  1. General compensatory strategies such as using lists and reminders,  structuring the day, and increasing routines may be helpful. Participation in  the [facility] group may be particularly helpful. 2. Given your reported decline in spelling and writing, and your measured  decline in multiple areas involving language skills, consultation with Speech  Language  Therapy may be helpful. You are also encouraged to follow up with  neurology for help with differential diagnosis and treatment. 3. Re-evaluation of your cognitive difficulties is recommended in 18 to 24  months. This will allow [place] to evaluate any progression of your symptoms and  to assist with treatment planning.  4. Continued mental health treatment is also strongly recommended. 5. It is also strongly recommended that you continue to work with your  medical team to address your physical symptoms (e.g., chronic pain) which can  cause distractibility and reduce cognitive efficiency. 6. You should continue to engage in leisure and social activities.  7. Activities and practices related to general health and wellbeing are  recommended, such as good sleep hygiene, regular exercise, eating a heart- healthy diet, and consuming alcohol and caffeine in moderation.  8. Information on [facility] ([facility] electronic record access for  patients) was provided. 9. If you find yourself in crisis, please call the Crisis Line phone number  ([phone], then press [phone]), go to the ER, or call emergency services.  For additional questions, please feel free to call Dr. Palma Holter at  [phone], x. [phone]    PATIENT REPORTED OUTCOME MEASURES (PROM): Communication Effectiveness Survey: provided today with pt scoring 19/32 (lower numbers indicating less effective communication). Pt scored "3" with talking with family members at home, with a person in the car, and with a familiar  person over the phone.                                                                                                                            TREATMENT DATE:  Semantic Feature Analysis= SFA  03/31/23: Pt and SLP went through an example of SFA using "screwdriver". Pt req'd min-mod A to be more descriptive and wordy when writing his thoughts about the object. He was independent by session end. Pt arrived with homework completed  100%. He has not had any anomic episodes nor any difficulty with memory since last visit. Pt thinks he is alright to cont with therapy tasks on his own at home and SLP agreed with one more visit in two weeks to verity cont'd progress.  Nam continues to be frustrated by his reduced legibility. SLP inquired about OT evaluation and pt stated [facility] and [facility] message.   03/26/23: Pt still is disappointed about his memory - gives an example of being at a birthday party with lots of extended family and "someone who called my by my nickname came up and I didn't know them from [place]". SLP suggested pt ask the person their name so he could use repetition or write it down strategies, but pt stated he was uncomfortable asking this person their name. SLP reiterated he could have used that information to recall the person's name for next time but he told SLP he was still uncomfortable. Pt says that he is using alarms/reminders for appointments, calendar for people's birthdays, and an alarm for his medication times. Pt asked SLP if his crossword game on his phone was appropriate for him to use for word finding. SLP assessed with pt and told him it looked good for word finding, but SLP suggested use of SFA and educated pt about this today. SLP and pt to go over this next session  03/19/23: Pt and SLP to talk next session about possibility of pt reducing to once every other week. Pt completed 95% of all homework - did not have 2 responsive naming items and 3 part/whole completed. 98% correct overall. Pt cont to tell SLP that he is pleased overall with his improved word finding but is still frustrated with his memory. Due to this SLP reviewed memory strategies with pt. Fayrene Fearing told SLP 2 examples of decr'd shot term memrory, and SLP suggested a memory strategy that would work with each of his examples.   03/14/23: Pt told SLP two strategies he could use for improving memory. Pt shared with SLP "(he uses) the  memory strategies everyday." Izreal indicated these strategies have been helpful for him. Homework completed 100% success (adding sentences to stories, writing 4-6 sentence stories for specific situations) for spelling.  He thought of 10 items in common categories with occasional min A for thinking of items (not the names of the items). Using SLP cues pt thought of 10 items in an average of 75 seconds. Average  6.25 items were thought of on his own.  03/10/23: No OT referral yet. Pt will put the message into "[facility]" and see what happens. He called again Friday after ST.  Pt states that his short term memory is different than it was however he is using strategy of association, and writing it down for the most assistance. SLP reviewed the memory strategies today with pt (see "pt instructions"). He has a system for his meds (once/day) and for his appointments. He uses billpay to auto pay his bills for him. He and SLP talked about also using sticky notes PRN.  03/07/23: No OT referral yet.  SLP educated pt about anomia strategies. Pt stated he is already using many of these.  Pt entered today with homework complete. He had more difficulty generating ideas (not necessarily the names) of items in two categories. PROM provided today with results above. Pt generated fils-ins for specific worded sentences with rare min A. Pt to cont this homework this weekend.  03/03/23: Pt needs PROM next session. He entered with homework completed with overall 85% success. SLP assisted pt with stimuli with errors. He req'd min-mod A usually with the responses. SLP had pt write three sentences using the target word if pt could not generate with min A from SLP. SLP had to shape pt's sentences (were all same sentence structure) initially but pt was independent afterwards. SLP provided homework and told pt to complete the same way - write sentences if unable to generate word with only a little assistance from wife/family.    02/28/23: Given "s" statement SLP wonders if pt appropritate for OT evaluation. Pt to consider and ask about script PRN.  When SLP told pt that it did not appear his wife was very concerned about the differences he has noticed and has sought ST for pt stated, "Yeah, she's not concerned about much at all."  SLP completed CLQT today; SLP wonders pt's visual processing due to slow response time with visual memory, and difficulty with design generation. Scores are below: The Cognitive Linguistic Quick Test (CLQT) was administered to assess the relative status of five cognitive domains: attention, memory, language, executive functioning, and visuospatial skills. Scores from 10 tasks were used to estimate severity ratings (standardized for age groups 18-69 years and 70-89 years) for each domain, a clock drawing task, as well as an overall composite severity rating of cognition.      Task Score Criterion Cut Scores  Personal Facts 8/8 8  Symbol Cancellation 12/12 10  Confrontation Naming 10/10 10  Clock Drawing  12/13 11  Story Retelling 7/10 5  Symbol Trails 10/10 6  Generative Naming 5/9 4  Design Memory 2/6 4  Mazes  8/8 4  Design Generation 5/13 5    Cognitive Domain Composite Score Severity Rating  Attention 193/215 WNL  Memory 123/185 Mild  Executive Function 28/40 WNL  Language 30/37 Low-WNL  Visuospatial Skills 83/105 WNL  Clock Drawing  12/13 WNL  Composite Severity Rating  WNL     02/24/23: CLQT initiated. SLP interviewed pt's wife re: pt's deficit areas, who states that she thinks pt's memory and word retrieval skills are age-appropriate and do not "alarm" her. Pt uses calendar app for appointments, has auto-pay for bills, and reports no difficulty taking meds. SLP suggested wife ask pt about details from ST appointments and if she suspects pt's performance less than it should be, she should cont to attend ST and should consider attending [facility] appointments with [facility].  02/21/23:  N/A  PATIENT EDUCATION: Education details: See "todays treatment" Person educated: Patient Education method: Explanation Education comprehension: verbalized understanding and returned demonstration   GOALS: Goals reviewed with patient? No  SHORT TERM GOALS: Target date: 03/21/23  Pt will complete CLQT Baseline: Goal status: met  2.  Pt will name 10 items in salient category for pt in one minute Baseline:  Goal status: partially met   3.  Pt will tell SLP two memory strategies he could use Baseline:  Goal status: Met  4. Pt will note 85% of written errors at the phrase level with compensations used PRN Baseline:  Goal status: Met   LONG TERM GOALS: Target date: 04/18/23  Pt will use one memory strategy in a session x3 Baseline: 03/26/23, 03/31/23 Goal status: INITIAL  2.  Pt will use anomia strategies in 10 minutes functional simple to mod complex conversation in 3 sessions Baseline:  Goal status: INITIAL   ASSESSMENT:  CLINICAL IMPRESSION: Patient is a 78 y.o. M who was seen today for treatment of aphasia and cognitive communication; See "Treatment Date" for more details. Pt cont to report not having anomic events, and he used memory strategy of checking his phone for upcoming appointments today. He is still disappointed about his legibility. Wardell underwent neuropsych evaluation which highlighted deficits in memory, abstract reasoning, semantic fluency, and attention. 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   OBJECTIVE IMPAIRMENTS: include attention, memory, and aphasia. These impairments are limiting patient from managing appointments, household responsibilities, ADLs/IADLs, and effectively communicating at home and in community. Factors affecting potential to achieve goals and functional outcome are ability to learn/carryover information. Patient will benefit from skilled SLP services to address above impairments and improve overall function.  REHAB  POTENTIAL: Good  PLAN:  SLP FREQUENCY: 1x/week  SLP DURATION: 8 weeks  PLANNED INTERVENTIONS: Language facilitation, Environmental controls, Cueing hierachy, Cognitive reorganization, Internal/external aids, Functional tasks, Multimodal communication approach, SLP instruction and feedback, Compensatory strategies, Patient/family education, and [id number] Treatment of speech (30 or 45 min)     Deeann Servidio, CCC-SLP 03/31/2023, 10:14 AM

## 2023-04-07 ENCOUNTER — Ambulatory Visit: Payer: Medicare HMO

## 2023-04-14 ENCOUNTER — Ambulatory Visit: Payer: Medicare HMO | Attending: Internal Medicine

## 2023-04-14 DIAGNOSIS — R4701 Aphasia: Secondary | ICD-10-CM | POA: Insufficient documentation

## 2023-04-14 DIAGNOSIS — R41841 Cognitive communication deficit: Secondary | ICD-10-CM | POA: Insufficient documentation

## 2023-04-14 NOTE — Therapy (Signed)
 OUTPATIENT SPEECH LANGUAGE PATHOLOGY TREATMENT/DISCHARGE   Patient Name: Dalton Kidd MRN: [id number] DOB:[birth year], 78 y.o., male Today's Date: 04/14/2023  [facility] REFERRING PROVIDER: Edison Nasuti, [facility] ([facility]  END OF SESSION:  End of Session - 04/14/23 1015     Visit Number 11    Number of Visits 17    Date for SLP Re-Evaluation 04/25/23    SLP Start Time 0932    SLP Stop Time  0958    SLP Time Calculation (min) 26 min    Activity Tolerance Patient tolerated treatment well                Past Medical History:  Diagnosis Date   Depression    GERD (gastroesophageal reflux disease)    Hyperlipidemia    Sleep apnea    Past Surgical History:  Procedure Laterality Date   ANKLE FRACTURE SURGERY Left    FEMUR SURGERY Left    THUMB ARTHROSCOPY Left    Patient Active Problem List   Diagnosis Date Noted   Hoarseness 11/06/2021   Mild persistent asthma without complication 03/09/2021   Diffuse lung disease 01/20/2021   Shortness of breath 01/15/2021   Nodule of upper lobe of right lung 11/14/2020   Abnormal CT of the chest 11/14/2020   SPEECH THERAPY DISCHARGE SUMMARY  Visits from Start of Care: 11  Current functional level related to goals / functional outcomes: See below. Pt demonstrated good ability to generate desired words in conversation. He told SLP he did not have any notable anomic events in the last 4 weeks. Additionally he was using memory compensations.    Remaining deficits: None noted currently, when compensations for memory are used. Pt reports his fine motor control in his hands has declined - he inquires about an OT evaluation.   Education / Equipment: See "today's treatment" entries below.    Patient agrees to discharge. Patient goals were met. Patient is being discharged due to being pleased with the current functional level..      ONSET DATE: "A year or two"   REFERRING DIAG: F80.1 Expressive language disorder  THERAPY DIAG:   Aphasia  Cognitive communication deficit  Rationale for Evaluation and Treatment: Rehabilitation  SUBJECTIVE:   SUBJECTIVE STATEMENT: "I went to the MD on - Wednesday, I believe." (Correct)  Pt accompanied by: self  PERTINENT HISTORY: Scant history regarding etiology or more details about pt's sx are available at this time. ([facility] referral) Pt states he has difficulty spelling words at this time, and his writing gets very small by the end of the sentence. [facility]/22 for parasthesias and 11/29/22.  PAIN:  Are you having pain? "No, not presently, but when I get up from sitting my lower back hurts."  FALLS: Has patient fallen in last 6 months?  Yes, Number of falls: 1, 11/10/22   PATIENT GOALS: "I used to be able to spell - didn't need any help."  OBJECTIVE:  Note: Objective measures were completed at Evaluation unless otherwise noted.  DIAGNOSTIC FINDINGS:  Report Status: Verified Date Reported: Nov 11, 2022 Date Verified: Nov 11, 2022 Verifier E-Sig:/ES/P Vergara[place]Wentland MD  Report: CT HEAD WITHOUT CONTRAST  HISTORY: S/p fall and hit head last night. No loc  COMPARISON: Head CT scan 05/24/2022.   TECHNIQUE: Axial CT images of the brain from skull base to  vertex, including portions of the face and sinuses, were obtained  without contrast. Supplemental 2-D reformatted images were  generated and reviewed  as needed.   FINDINGS:   Calvarium/skull base: No evidence of fracture or destructive  lesion. Mastoids and middle ear grossly clear.   Paranasal sinuses: Imaged portions clear.   Brain: No evidence of acute large territory of infarct. Mild  hypodensity involving the periventricular white matter is  nonspecific but probably consistent with chronic microvascular  ischemic changes. No mass effect, mass lesion, acute hemorrhage,  or hydrocephalus.   Impression: 1. No CT evidence of acute intracranial abnormality.  Electronically Signed  By: Thamas Jaegers Electronically Signed On: 11/11/2022 8:37 AM    Neuropsychological Test Results Name: Dalton Kidd Age: 74 years Date Tested: 11/28/22  Test Results: - MMSE: 27/30 correct; You were alert and generally oriented (i.e., you were  unsure of the county where this clinic is located), and you lost additional  points tasks assessing recall (2/3 correct) and copying. - Clock Drawing: Intact and Accurate - Cognitive Test Results o Impaired: Simple Attention, Naming, Writing Skills, Lexical Fluency, List  Learning, Scientist, research (life sciences)  o Intact: Working Memory*, Visual Scanning Speed, Visual Tracking Speed,  Graphomotor Speed*, Semantic Fluency, Oral Production, Auditory  Comprehension, Visual Discrimination, List Memory, Visual Learning and  Memory, Verbal Abstract Reasoning, Visual Abstract Reasoning*, Mental  Flexibility - Psychological Considerations: Consistent with your report during the  clinical interview, there is evidence of mild depression (GDS, 10/30).   * Statistically within normal limits, but lower than expected  When examining your serial neuropsychological testing (2018 and 2024), there was  evidence of: - STABLE (AND INTACT) performance on measures of visual scanning speed,  visual tracking speed, semantic fluency, auditory comprehension, visual  discrimination, list memory, visual learning/memory, verbal abstract  reasoning, and mental flexibility - REDUCED performance on measures of simple attention span, lexical  fluency, and list learning. Although still within normal limits, your scores  DECLINED SLIGHTLY on measures of graphomotor speed and working memory.  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  This diagnosis is based on your medical and  psychological history, report of  symptoms onset, and measured cognitive difficulties. The severity is mild due  to remaining independent in all activities of daily living at this time. Your  cognitive issues are likely exacerbated at times by your physical discomfort and  mild psychiatric distress.   Recommendations:  1. General compensatory strategies such as using lists and reminders,  structuring the day, and increasing routines may be helpful. Participation in  the [facility] group may be particularly helpful. 2. Given your reported decline in spelling and writing, and your measured  decline in multiple areas involving language skills, consultation with Speech  Language Therapy may be helpful. You are also encouraged to follow up with  neurology for help with differential diagnosis and treatment. 3. Re-evaluation of your cognitive difficulties is recommended in 18 to 24  months. This will allow [place] to evaluate any progression of your symptoms and  to assist with treatment planning.  4. Continued mental health treatment is also strongly recommended. 5. It is also strongly recommended that you continue to work with your  medical team to address your physical symptoms (e.g., chronic pain) which can  cause distractibility and reduce cognitive efficiency. 6. You should continue to engage in leisure and social activities.  7. Activities and practices related to general health and wellbeing are  recommended, such as good  sleep hygiene, regular exercise, eating a heart- healthy diet, and consuming alcohol and caffeine in moderation.  8. Information on [facility] ([facility] electronic record access for  patients) was provided. 9. If you find yourself in crisis, please call the Crisis Line phone number  (988, then press 1), go to the ER, or call emergency services.  For additional questions, please feel free to call Dr. Palma Holter at  [phone], x. [phone]    PATIENT REPORTED  OUTCOME MEASURES (PROM): Communication Effectiveness Survey: provided today with pt scoring 19/32 (lower numbers indicating less effective communication). Pt scored "3" with talking with family members at home, with a person in the car, and with a familiar person over the phone.                                                                                                                            TREATMENT DATE:  Semantic Feature Analysis= SFA  04/14/23: SLP filled out paperwork faxed to this clinic by the [facility] after last session, addressing pt's desire for OT evaluation due to poor legibility/reduced fine motor control of both UE. Pt has not heard anything back yet to his knowledge re: this. Today he reports he has continued at a level consistent with previous session; No anomic episodes recalled. Today SLP engaged pt in 15 minutes simple to mod complex conversation and pt without anomic events except for name of wife's church. Pt used description strategy for compensation. He continues to use his phone for appointment management and checked this today re: [facility] appointments, pays bills as they arrive, and reports medication administration is WNL as pt puts meds in an obvious/viewable place. Reports continuing to use alarms and timers PRN.   03/31/23: Pt and SLP went through an example of SFA using "screwdriver". Pt req'd min-mod A to be more descriptive and wordy when writing his thoughts about the object. He was independent by session end. Pt arrived with homework completed 100%. He has not had any anomic episodes nor any difficulty with memory since last visit. Pt thinks he is alright to cont with therapy tasks on his own at home and SLP agreed with one more visit in two weeks to verity cont'd progress.  Dalton Kidd continues to be frustrated by his reduced legibility. SLP inquired about OT evaluation and pt stated he called [facility] last week and left a message.   03/26/23: Pt still is disappointed about his memory  - gives an example of being at a birthday party with lots of extended family and "someone who called my by my nickname came up and I didn't know them from [place]". SLP suggested pt ask the person their name so he could use repetition or write it down strategies, but pt stated he was uncomfortable asking this person their name. SLP reiterated he could have used that information to recall the person's name for next time but he told SLP he was still uncomfortable. Pt says that  he is using alarms/reminders for appointments, calendar for people's birthdays, and an alarm for his medication times. Pt asked SLP if his crossword game on his phone was appropriate for him to use for word finding. SLP assessed with pt and told him it looked good for word finding, but SLP suggested use of SFA and educated pt about this today. SLP and pt to go over this next session  03/19/23: Pt and SLP to talk next session about possibility of pt reducing to once every other week. Pt completed 95% of all homework - did not have 2 responsive naming items and 3 part/whole completed. 98% correct overall. Pt cont to tell SLP that he is pleased overall with his improved word finding but is still frustrated with his memory. Due to this SLP reviewed memory strategies with pt. Dalton Kidd told SLP 2 examples of decr'd shot term memrory, and SLP suggested a memory strategy that would work with each of his examples.   03/14/23: Pt told SLP two strategies he could use for improving memory. Pt shared with SLP "(he uses) the memory strategies everyday." Damarian indicated these strategies have been helpful for him. Homework completed 100% success (adding sentences to stories, writing 4-6 sentence stories for specific situations) for spelling.  He thought of 10 items in common categories with occasional min A for thinking of items (not the names of the items). Using SLP cues pt thought of 10 items in an average of 75 seconds. Average 6.25 items were thought of on his  own.  03/10/23: No OT referral yet. Pt will put the message into "[facility]" and see what happens. He called again Friday after ST.  Pt states that his short term memory is different than it was however he is using strategy of association, and writing it down for the most assistance. SLP reviewed the memory strategies today with pt (see "pt instructions"). He has a system for his meds (once/day) and for his appointments. He uses billpay to auto pay his bills for him. He and SLP talked about also using sticky notes PRN.  03/07/23: No OT referral yet.  SLP educated pt about anomia strategies. Pt stated he is already using many of these.  Pt entered today with homework complete. He had more difficulty generating ideas (not necessarily the names) of items in two categories. PROM provided today with results above. Pt generated fils-ins for specific worded sentences with rare min A. Pt to cont this homework this weekend.  03/03/23: Pt needs PROM next session. He entered with homework completed with overall 85% success. SLP assisted pt with stimuli with errors. He req'd min-mod A usually with the responses. SLP had pt write three sentences using the target word if pt could not generate with min A from SLP. SLP had to shape pt's sentences (were all same sentence structure) initially but pt was independent afterwards. SLP provided homework and told pt to complete the same way - write sentences if unable to generate word with only a little assistance from wife/family.   02/28/23: Given "s" statement SLP wonders if pt appropritate for OT evaluation. Pt to consider and ask about script PRN.  When SLP told pt that it did not appear his wife was very concerned about the differences he has noticed and has sought ST for pt stated, "Yeah, she's not concerned about much at all."  SLP completed CLQT today; SLP wonders pt's visual processing due to slow response time with visual memory, and difficulty with design  generation.  Scores are below: The Cognitive Linguistic Quick Test (CLQT) was administered to assess the relative status of five cognitive domains: attention, memory, language, executive functioning, and visuospatial skills. Scores from 10 tasks were used to estimate severity ratings (standardized for age groups 18-69 years and 70-89 years) for each domain, a clock drawing task, as well as an overall composite severity rating of cognition.      Task Score Criterion Cut Scores  Personal Facts 8/8 8  Symbol Cancellation 12/12 10  Confrontation Naming 10/10 10  Clock Drawing  12/13 11  Story Retelling 7/10 5  Symbol Trails 10/10 6  Generative Naming 5/9 4  Design Memory 2/6 4  Mazes  8/8 4  Design Generation 5/13 5    Cognitive Domain Composite Score Severity Rating  Attention 193/215 WNL  Memory 123/185 Mild  Executive Function 28/40 WNL  Language 30/37 Low-WNL  Visuospatial Skills 83/105 WNL  Clock Drawing  12/13 WNL  Composite Severity Rating  WNL     02/24/23: CLQT initiated. SLP interviewed pt's wife re: pt's deficit areas, who states that she thinks pt's memory and word retrieval skills are age-appropriate and do not "alarm" her. Pt uses calendar app for appointments, has auto-pay for bills, and reports no difficulty taking meds. SLP suggested wife ask pt about details from ST appointments and if she suspects pt's performance less than it should be, she should cont to attend ST and should consider attending [facility] appointments with pt.   02/21/23: N/A  PATIENT EDUCATION: Education details: See "todays treatment" Person educated: Patient Education method: Explanation Education comprehension: verbalized understanding and returned demonstration   GOALS: Goals reviewed with patient? No  SHORT TERM GOALS: Target date: 03/21/23  Pt will complete CLQT Baseline: Goal status: met  2.  Pt will name 10 items in salient category for pt in one minute Baseline:  Goal status: partially  met   3.  Pt will tell SLP two memory strategies he could use Baseline:  Goal status: Met  4. Pt will note 85% of written errors at the phrase level with compensations used PRN Baseline:  Goal status: Met   LONG TERM GOALS: Target date: 04/18/23  Pt will use one memory strategy in a session x3 Baseline: 03/26/23, 03/31/23 Goal status: Met  2.  Pt will use anomia strategies in 10 minutes functional simple to mod complex conversation in 3 sessions Baseline:  Goal status: deferred - pt states no difficulty with anomia    ASSESSMENT:  CLINICAL IMPRESSION: Pt agrees with d/c today. Patient is a 78 y.o. M who was seen today for treatment of aphasia and cognitive communication; See "Treatment Date" for more details. Pt cont to report not having anomic events, and he used memory strategy of checking his phone for upcoming appointments today. He is still disappointed about his legibility. Eliya underwent neuropsych evaluation which highlighted deficits in memory, abstract reasoning, semantic fluency, and attention. 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   OBJECTIVE IMPAIRMENTS: include attention, memory, and aphasia. These impairments are limiting patient from managing appointments, household responsibilities, ADLs/IADLs, and effectively communicating at home and in community. Factors affecting potential to achieve goals and functional outcome are ability to learn/carryover information. Patient will benefit from skilled SLP services to address above impairments and improve overall function.  REHAB POTENTIAL: Good  PLAN:  SLP FREQUENCY: 1x/week  SLP DURATION: 8 weeks  PLANNED INTERVENTIONS: Language facilitation, Environmental controls, Cueing hierachy, Cognitive reorganization, Internal/external aids, Functional tasks, Multimodal communication approach, SLP instruction  and feedback, Compensatory strategies, Patient/family education, and [address] Treatment of speech (30 or 45 min)      Dalton Kidd, CCC-SLP 04/14/2023, 10:17 AM

## 2023-10-23 IMAGING — CT CT CHEST W/O CM
2 of 4 series · 15 of 36 positions shown, 18 images · non-contrast
Comparison: September 28, 2020.

CLINICAL DATA: Follow-up lung nodule in a 75-year-old male.

EXAM:
CT CHEST WITHOUT CONTRAST
TECHNIQUE: Multidetector CT imaging of the chest was performed following the
standard protocol without IV contrast.

[Series 2: thorax · axial · 0.70mm/px · z∈[-284,-42]mm · 12 of 145 slices shown, 15 images]
[im 12/145  mediastinal]
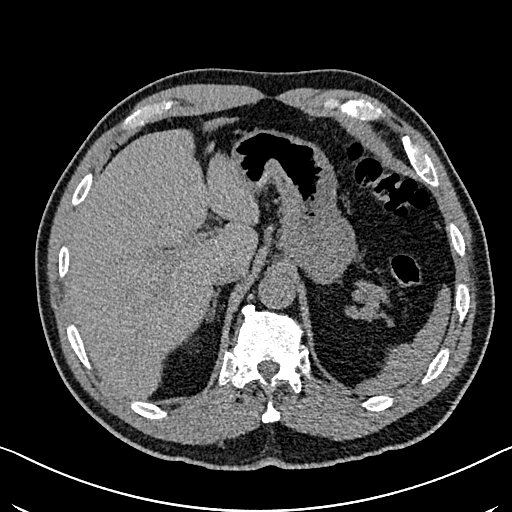
[im 12/145  lung]
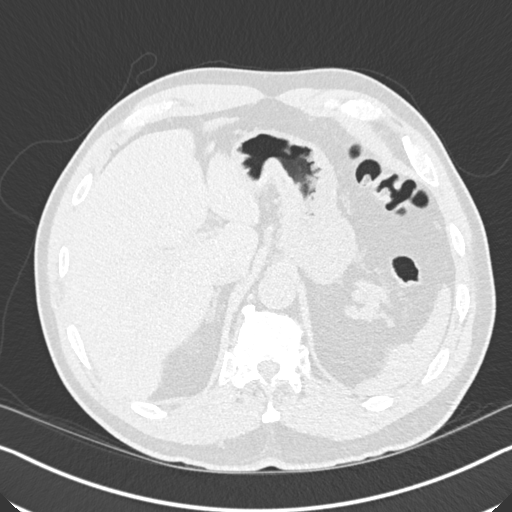
[im 23/145  lung]
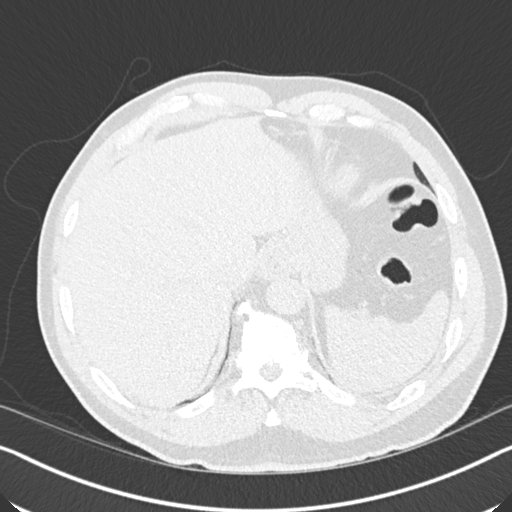
[im 34/145  lung]
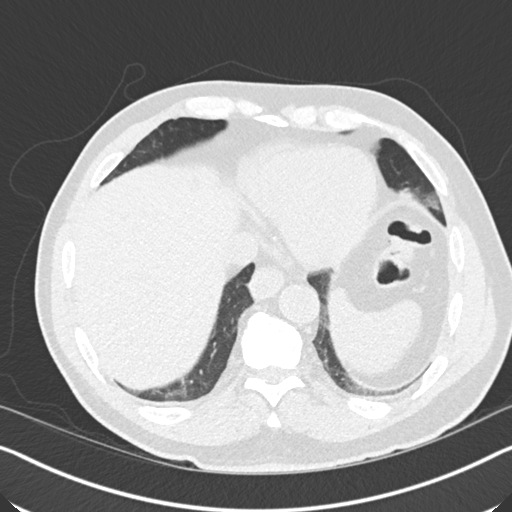
[im 45/145  lung]
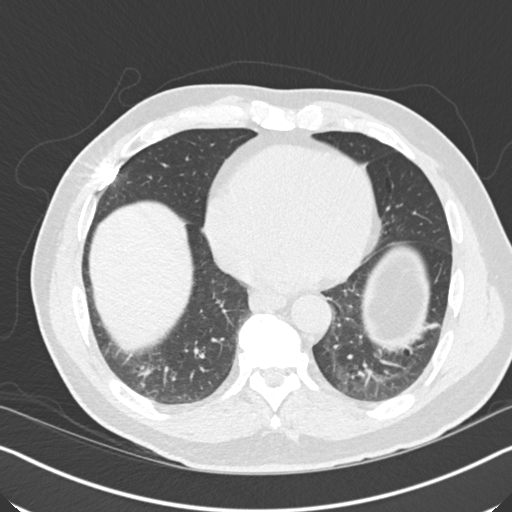
[im 56/145  mediastinal]
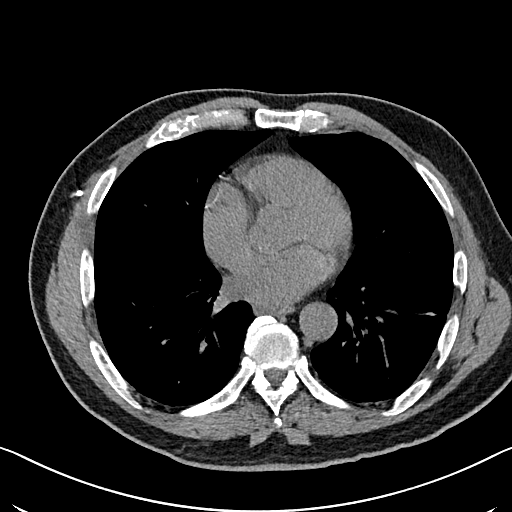
[im 56/145  lung]
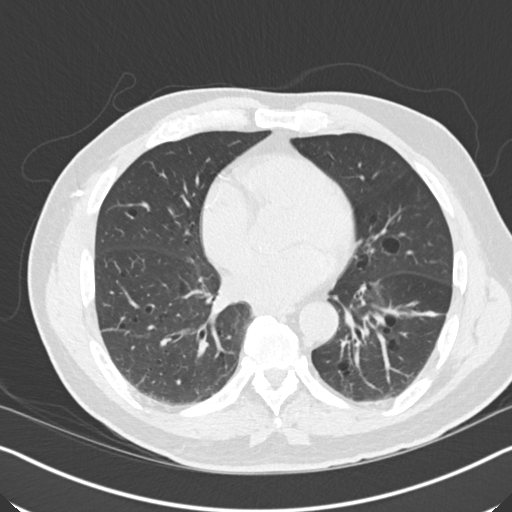
[im 67/145  lung]
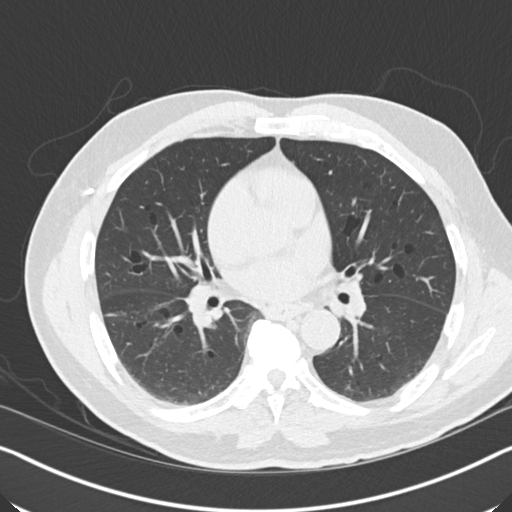
[im 78/145  lung]
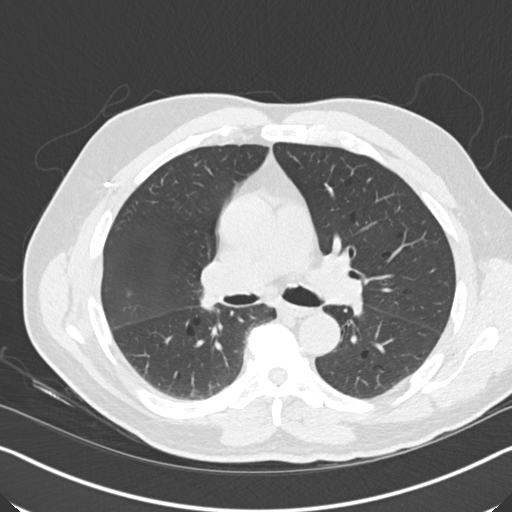
[im 89/145  lung]
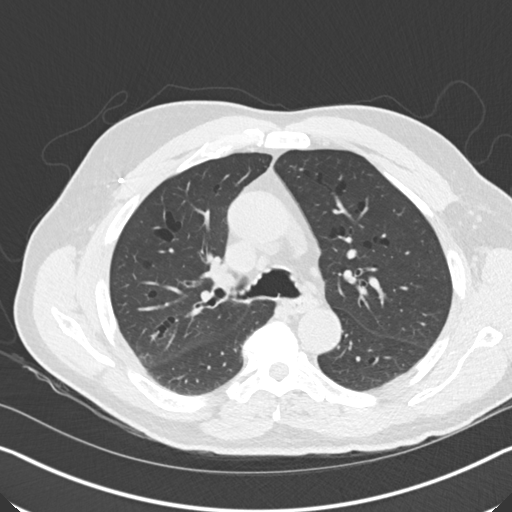
[im 100/145  mediastinal]
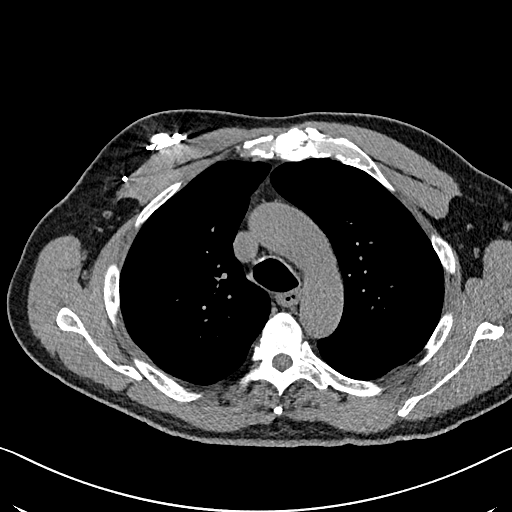
[im 100/145  lung]
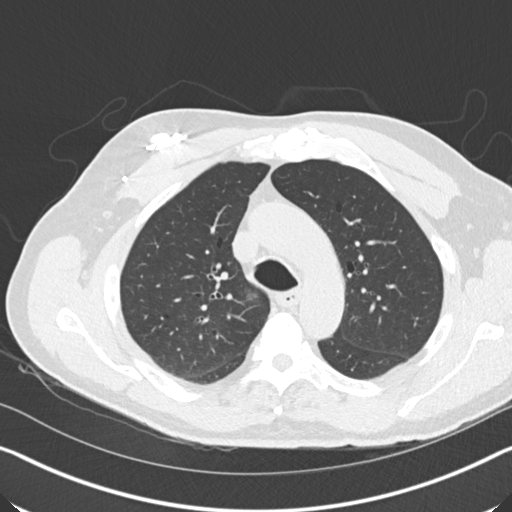
[im 111/145  lung]
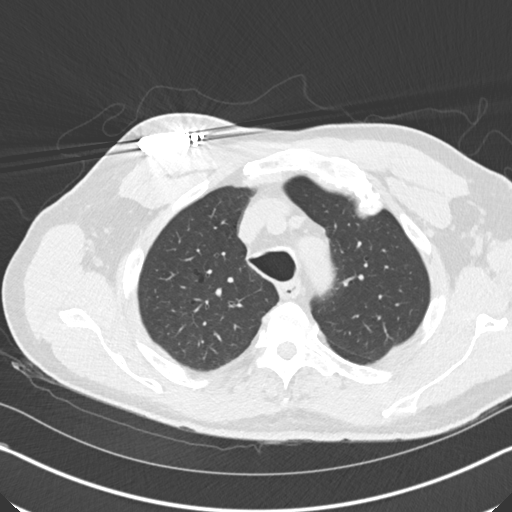
[im 122/145  lung]
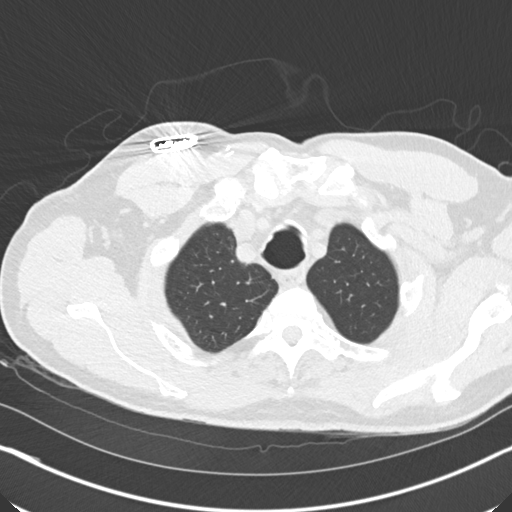
[im 133/145  lung]
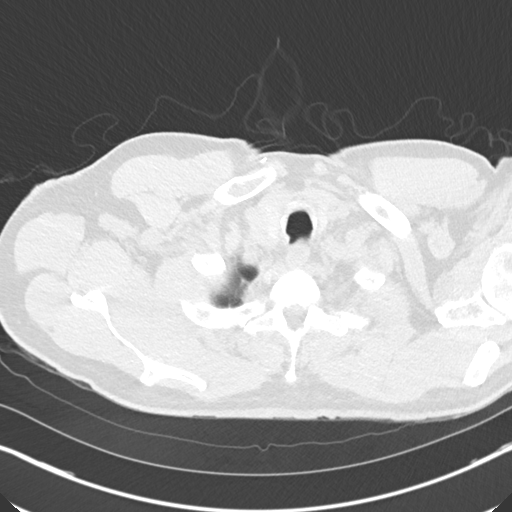

[Series 5: coronal · coronal · 0.59mm/px · 3 of 120 slices shown]
[im 24/120  lung]
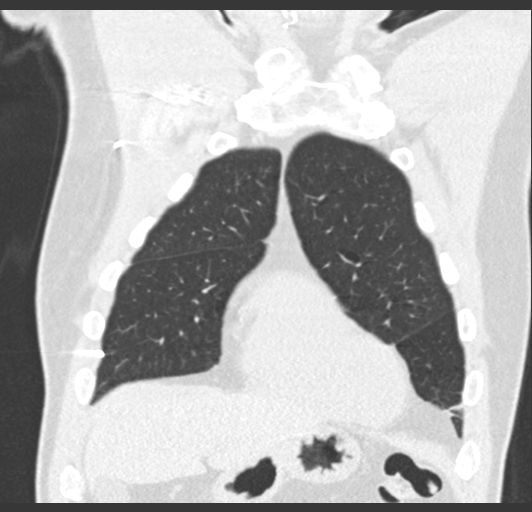
[im 48/120  lung]
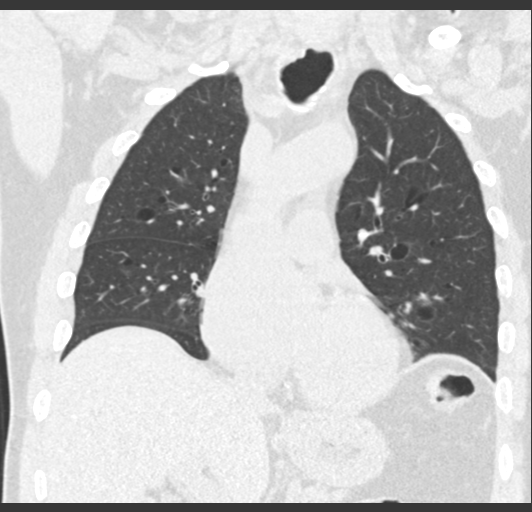
[im 72/120  lung]
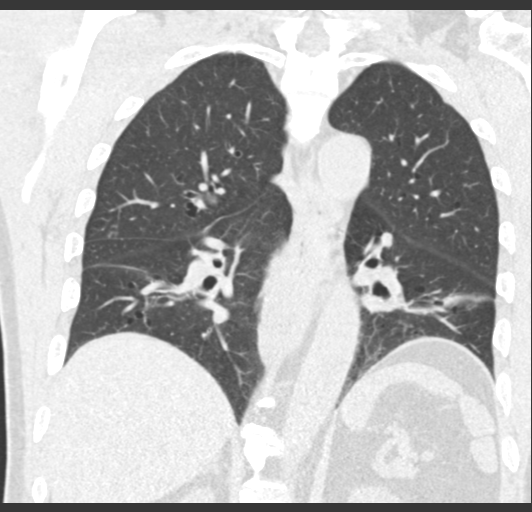

[15 of 36 positions shown; findings below may reference images not displayed]

FINDINGS: Cardiovascular: Three-vessel coronary artery disease. Normal heart
size without substantial pericardial effusion. Normal caliber of the
central pulmonary vasculature. Limited assessment of cardiovascular
structures given lack of intravenous contrast.

Mediastinum/Nodes: No thoracic inlet, axillary, mediastinal or hilar
adenopathy. Esophagus grossly normal. Small hiatal hernia.

Lungs/Pleura: Pulmonary since, some of the cystic areas appear to
represent cystic and varicoid bronchiectatic changes. These areas
show no substantial change compared to previous imaging. Areas
remain thin walled. RIGHT upper lobe nodule appears less dense than
on the previous study. This measures 10 x 10 mm on today's exam as
compared to 12 x 10 mm on the previous exam.

Additional nodules in the RIGHT RIGHT middle lobe also appear less
conspicuous than on previous imaging minimal nodularity on image
74/3) 3-4 mm largest nodule in this location on the previous study
was 6 mm.

No effusion. No consolidative process. No new pulmonary nodules.
Airways are patent.

Upper Abdomen: Incidental imaging of upper abdominal contents shows
no acute process. Imaged portions of liver, gallbladder, pancreas,
spleen and adrenal glands are unremarkable. Upper aspects of LEFT
and RIGHT kidney also without acute process, likewise incomplete
visualization of the gastrointestinal tract.

Musculoskeletal: RIGHT-sided hypoglossal nerve stimulator with power
pack over the RIGHT chest with similar appearance, lead terminating
in the sixth intercostal space as before.
IMPRESSION: Decreased density of a RIGHT upper lobe pulmonary nodule and slight
decrease in size as compared to previous imaging also with
resolution and or decreased conspicuity of other nodules in the
chest. Finding strongly favor resolving infectious or inflammatory
process. Consider additional three-month follow-up to ensure
complete resolution. Decreased size of the dominant nodule is more
pronounced in the coronal plane.

Cystic changes throughout the chest without interval change. Some
areas may be in communication with bronchial structures.

Three-vessel coronary artery disease.

Small hiatal hernia.

RIGHT-sided hypoglossal nerve stimulator with power pack over the
RIGHT chest with similar appearance, lead terminating in the sixth
intercostal space as before.

## 2023-10-24 ENCOUNTER — Ambulatory Visit (HOSPITAL_BASED_OUTPATIENT_CLINIC_OR_DEPARTMENT_OTHER): Admitting: Pulmonary Disease

## 2023-10-24 ENCOUNTER — Encounter (HOSPITAL_BASED_OUTPATIENT_CLINIC_OR_DEPARTMENT_OTHER): Payer: Self-pay | Admitting: Pulmonary Disease

## 2023-10-24 VITALS — BP 149/75 | HR 76 | Ht 69.0 in | Wt 195.0 lb

## 2023-10-24 DIAGNOSIS — R490 Dysphonia: Secondary | ICD-10-CM

## 2023-10-24 DIAGNOSIS — J4530 Mild persistent asthma, uncomplicated: Secondary | ICD-10-CM | POA: Diagnosis not present

## 2023-10-24 DIAGNOSIS — R053 Chronic cough: Secondary | ICD-10-CM

## 2023-10-24 DIAGNOSIS — J984 Other disorders of lung: Secondary | ICD-10-CM

## 2023-10-24 MED ORDER — GABAPENTIN 100 MG PO CAPS: 100.0000 mg | ORAL_CAPSULE | Freq: Two times a day (BID) | ORAL | 2 refills | Status: DC

## 2023-10-24 MED ORDER — IPRATROPIUM-ALBUTEROL 0.5-2.5 (3) MG/3ML IN SOLN: 3.0000 mL | Freq: Four times a day (QID) | RESPIRATORY_TRACT | 2 refills | Status: DC | PRN

## 2023-10-24 NOTE — Patient Instructions (Signed)
 Mild persistent asthma/shortness of breath Chronic cough --ORDER nebulizer. Use with Duonebs twice a day --CONTINUE Albuterol  AS NEEDED for shortness of breath or wheezing. This is RESCUE inhaler --START gabapentin  100 mg twice a day for possible neurogenic cough

## 2023-10-24 NOTE — Progress Notes (Signed)
 Subjective:   PATIENT ID: Dalton Kidd GENDER: male DOB: 1945/02/25, MRN: [id number]   HPI  Chief Complaint  Patient presents with   Follow-up   Reason for Visit: Follow-up  [name] is a 78 year old male never smoker with OSA s/p Inspire in 2018 who presents for follow-up  Synopsis: He was previously seen by Dr. Corrie at the [facility]. Listed pulmonary related problems include asthma, allergic rhinitis and sleep apnea. He was referred to [facility] in 2022 for CT with incidental cystic lung disease and right upper lobe nodule measuring 10mm. He previously had PFTs in 04/2020.   At baseline he is living independently. Able to perform regular activity including household and yard work. Denies shortness of breath or wheezing. Occasional cough, non-productive. He reports history reflux and regurgitation with some meals and he is on an anti-reflux daily that has helped resolve.   01/15/21 He recently had the flu three weeks ago but has since recovered at home. Tested positive at the [facility]. He reports that sometimes has difficulty getting enough breath to speak. It is brief <1s but he feels he has to quickly breath. No wheezing or coughing. No issues at night or when laying down. His activities are not limited.  08/06/21 He reports hoarseness with Wixela. But maybe some improvement with his lungs. He is able to talk longer however now limited by hoarseness. Denies wheezing or coughing. No nocturnal symptoms.  11/06/21 Since our last visit he has been compliant with Symbicort  with spacer. Has not noticed a change with his hoarseness. Reports shortness of breath is unchanged. He is not talking as much or as active. No exacerbations since our last visit. Denies wheezing or coughing. No nocturnal symptoms.  01/21/22 Since our last visit he was seen by ENT and reports negative work-up for his hoarseness. He takes medications of reflux but no signs of this on exam.Has not had any  benefit recently with Symbicort  and while talking his voice stops. Denies wheezing or coughing.  06/07/22 He is no longer taking any inhalers. He tried Stiolto for a month but still felt hoarse. He has been off inhalers for over three months and still has unchanged hoarseness. Denies wheezing or cough.   10/24/23 Our last visit with Pulmonary was over 1.5 years ago. He has not been on Stiolto for many months day due to hoarseness. However he remains hoarse. He has been on albuterol  2 puffs twice a day. He reports productive coughing with white sputum throughout the day. Has some shortness of breath with talking but not sure with activity bc he is primarily sedentary.  Social History: [employer] - lived in [place] 3-5 months Tried cigarettes but never smoked. Never vaped. 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  Past Medical History:  Diagnosis Date   Depression    GERD (gastroesophageal reflux disease)    Hyperlipidemia    Sleep apnea      Family History  Problem Relation Age of Onset   Diabetes Mother    Colon cancer Brother    Sleep apnea Son    Esophageal cancer Neg Hx    Rectal cancer Neg Hx    Stomach cancer Neg Hx     Social History   Occupational History   Occupation: retired  Tobacco Use   Smoking status: Never   Smokeless tobacco: Never  Building services engineer  status: Never Used  Substance and Sexual Activity   Alcohol use: No   Drug use: No   Sexual activity: Not on file    Not on File   Outpatient Medications Prior to Visit  Medication Sig Dispense Refill   Artificial Tear Ointment (ARTIFICIAL TEARS) ointment Place 1 application into both eyes at bedtime.     buPROPion (WELLBUTRIN XL) 300 MG 24 hr tablet Take 300 mg by mouth daily.     carboxymethylcellulose (REFRESH PLUS) 0.5 % SOLN Place 1 drop into both eyes 3 (three) times daily as needed (for dry  eyes).     loratadine (CLARITIN) 10 MG tablet Take 10 mg by mouth daily as needed.     pantoprazole (PROTONIX) 40 MG tablet Take 40 mg by mouth daily.     rosuvastatin (CRESTOR) 40 MG tablet TAKE ONE-HALF TABLET BY MOUTH ONCE A DAY FOR CHOLESTEROL     sildenafil (VIAGRA) 100 MG tablet Take 50 mg by mouth as needed.     Tiotropium Bromide-Olodaterol (STIOLTO RESPIMAT ) 2.5-2.5 MCG/ACT AERS Inhale 2 puffs into the lungs daily. 4 g 5   No facility-administered medications prior to visit.    Review of Systems  Constitutional:  Negative for chills, diaphoresis, fever, malaise/fatigue and weight loss.  HENT:  Negative for congestion.   Respiratory:  Negative for cough, hemoptysis, sputum production, shortness of breath and wheezing.        Hoarseness  Cardiovascular:  Negative for chest pain, palpitations and leg swelling.     Objective:   Vitals:   10/24/23 1026 10/24/23 1100  BP: (!) 152/82 (!) 149/75  Pulse: 76   SpO2: 97%   Weight: 195 lb (88.5 kg)   Height: 5' 9 (1.753 m)    SpO2: 97 %  Physical Exam: General: Well-appearing, no acute distress HENT: [place], AT, hoarseness Eyes: EOMI, no scleral icterus Respiratory: Clear to auscultation bilaterally.  No crackles, wheezing or rales Cardiovascular: RRR, -M/R/G, no JVD Extremities:-Edema,-tenderness Neuro: AAO x4, CNII-XII grossly intact Psych: Normal mood, normal affect  Data Reviewed:  Imaging: CT Chest 09/28/20 - Right upper lobe nodule ~1cm with diffuse scattered thin walled cysts throughout lungs bilaterally     CT Chest 01/09/21 - Unchanged cystic/bronchiectatic lesions. RUL nodule similar size ~1cm  CT Chest 07/30/21 - Interval improved in right upper lobe nodules. Similar scattered blebs and bullae in both lungs.   PFT: 03/09/21 FVC 3.28 (87%) FEV1 2.54 (90%) Ratio 73  TLC 85% DLCO 90% Interpretation: Normal spirometry  Labs: CBC No results found for: WBC, RBC, HGB, HCT, PLT, MCV, MCH, MCHC,  RDW, LYMPHSABS, MONOABS, EOSABS, BASOSABS     Assessment & Plan:   Discussion: 78 year old male never smoker with diffuse cystic lung disease, solitary nodules and OSA s/p Inspire who presents for follow-up. Off inhalers and remains hoarse, likely related to chronic cough. Asthma with minimal symptoms off inhalers though now some worsening cough and shortness of breath. Prefers to avoid inhalers so will trial nebulizers for symptom control. Will also consider neurogenic cough and start with low dose gabapentin  with plan to titrate up if tolerated.  Mild persistent asthma/shortness of breath Chronic cough --ORDER nebulizer. Use with Duonebs twice a day --CONTINUE Albuterol  AS NEEDED for shortness of breath or wheezing. This is RESCUE inhaler --START gabapentin  100 mg twice a day for possible neurogenic cough  Asthma Action Plan Use albuterol  2 puffs 3-4 times a day for worsening shortness of breath, wheezing and cough. If you symptoms do not  improve in 24-48 hours, please our office for evaluation and/or prednisone taper.  Hoarseness ENT eval neg 01/2022. Has been off inhalers since 02/2022 with no improvement in hoarseness  Diffuse cystic lung disease Low suspicion for LAM, PLCH, BHD. LIP in differential --Neg serologic testing: HIV, antinuclear antibody, anti-Ro/SSA, anti-La/SSB, rheumatoid factor  Health Maintenance Immunization History  Administered Date(s) Administered    sv, Bivalent, Protein Subunit Rsvpref,pf (Abrysvo) 01/24/2022   Fluad Quad(high Dose 65+) 11/19/2019, 11/15/2020   H1N1 03/10/2008   INFLUENZA, HIGH DOSE SEASONAL PF 11/21/2021   Influenza Split 11/12/2014   Influenza, Quadrivalent, Recombinant, Inj, Pf 11/27/2017   Influenza,inj,Quad PF,6+ Mos 01/11/2016   Influenza,trivalent, recombinat, inj, PF 01/08/2017, 12/26/2017   Influenza-Unspecified 12/29/2001, 02/16/2004, 01/16/2005, 01/07/2006, 03/09/2007, 11/13/2007, 10/12/2008, 12/01/2009, 01/04/2011,  01/13/2012, 12/07/2012, 01/13/2015, 10/13/2018   Moderna Covid-19 Fall Seasonal Vaccine 33yrs & older 01/10/2022, 11/01/2022, 10/02/2023   Moderna Covid-19 Vaccine Bivalent Booster 60yrs & up 11/28/2020, 07/11/2021   Moderna Sars-Covid-2 Vaccination 03/18/2019, 04/15/2019, 12/16/2019, 05/22/2020   Pneumococcal Conjugate-13 09/02/2013   Pneumococcal Polysaccharide-23 08/28/2015   Pneumococcal-Unspecified 01/25/2002   Tdap 08/22/2011   Zoster Recombinant(Shingrix) 12/29/2018, 03/02/2019   Zoster, Live 12/09/2013   CT Lung Screen - not indicated. No significant tobacco history  Orders Placed This Encounter  Procedures   Ambulatory Referral for DME    Referral Priority:   Routine    Referral Type:   Durable Medical Equipment Purchase    Number of Visits Requested:   1   Meds ordered this encounter  Medications   gabapentin  (NEURONTIN ) 100 MG capsule    Sig: Take 1 capsule (100 mg total) by mouth 2 (two) times daily.    Dispense:  60 capsule    Refill:  2   ipratropium-albuterol  (DUONEB) 0.5-2.5 (3) MG/3ML SOLN    Sig: Take 3 mLs by nebulization every 6 (six) hours as needed (shortness of breath, wheezing, cough).    Dispense:  360 mL    Refill:  2    Return in about 2 months (around 12/24/2023).  I have spent a total time of 32-minutes on the day of the appointment including chart review, data review, collecting history, coordinating care and discussing medical diagnosis and plan with the patient/family. Past medical history, allergies, medications were reviewed. Pertinent imaging, labs and tests included in this note have been reviewed and interpreted independently by me.  Ladavion Savitz Slater Staff, MD [facility]

## 2023-10-29 DIAGNOSIS — R053 Chronic cough: Secondary | ICD-10-CM | POA: Diagnosis not present

## 2023-10-29 DIAGNOSIS — J4530 Mild persistent asthma, uncomplicated: Secondary | ICD-10-CM | POA: Diagnosis not present

## 2024-01-05 ENCOUNTER — Encounter (HOSPITAL_BASED_OUTPATIENT_CLINIC_OR_DEPARTMENT_OTHER): Payer: Self-pay | Admitting: Pulmonary Disease

## 2024-01-05 ENCOUNTER — Ambulatory Visit (INDEPENDENT_AMBULATORY_CARE_PROVIDER_SITE_OTHER): Admitting: Pulmonary Disease

## 2024-01-05 VITALS — BP 134/82 | Temp 98.6°F | Ht 69.0 in | Wt 203.6 lb

## 2024-01-05 DIAGNOSIS — I10 Essential (primary) hypertension: Secondary | ICD-10-CM

## 2024-01-05 DIAGNOSIS — J984 Other disorders of lung: Secondary | ICD-10-CM

## 2024-01-05 DIAGNOSIS — R053 Chronic cough: Secondary | ICD-10-CM

## 2024-01-05 DIAGNOSIS — J4530 Mild persistent asthma, uncomplicated: Secondary | ICD-10-CM

## 2024-01-05 DIAGNOSIS — R490 Dysphonia: Secondary | ICD-10-CM

## 2024-01-05 MED ORDER — GABAPENTIN 100 MG PO CAPS: 200.0000 mg | ORAL_CAPSULE | Freq: Three times a day (TID) | ORAL | 2 refills | Status: DC

## 2024-01-05 NOTE — Progress Notes (Signed)
 Subjective:   PATIENT ID: Dalton Kidd GENDER: male DOB: 17-May-1945, MRN: [id number]   HPI  Chief Complaint  Patient presents with   Cough   Reason for Visit: Follow-up  [name] is a 78 year old male never smoker with OSA s/p Inspire in 2018 who presents for follow-up  Synopsis: He was previously seen by Dr. Corrie at the [facility]. Listed Kidd related problems include asthma, allergic rhinitis and sleep apnea. He was referred to Dalton Kidd in 2022 for CT with incidental cystic lung disease and right upper lobe nodule measuring 10mm. He previously had PFTs in 04/2020.   At baseline he is living independently. Able to perform regular activity including household and yard work. Denies shortness of breath or wheezing. Occasional cough, non-productive. He reports history reflux and regurgitation with some meals and he is on an anti-reflux daily that has helped resolve.   2022 - Established care with Dalton Kidd. Influenza in November 2023 - Well controlled asthma on Wixela. Hoarseness with inhaler. Switched to Symbicort  with spacer with no improvement. ENT work-up negative. No exacerbations this year.  2024 - Inhalers discontinued due to hoarseness. Tried Stiolto without improvement. Asthma stable off bronchodilators 2025 - No maintenance inhalers with no exacerbations however productive cough returned. Unchanged hoarseness. Patient preferred nebulizers however unchanged symptoms. Trial of gabapentin  started  01/05/24 Since our last visit he was started on nebulizer twice a day and gabapentin  with no improvement with cough. Continues to produce white sputum. No wheezing. Not active at baseline.   Social History: [employer] - lived in [place] 3-5 months Tried cigarettes but never smoked. Never vaped. 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  Past Medical  History:  Diagnosis Date   Depression    GERD (gastroesophageal reflux disease)    Hyperlipidemia    Sleep apnea      Family History  Problem Relation Age of Onset   Diabetes Mother    Colon cancer Brother    Sleep apnea Son    Esophageal cancer Neg Hx    Rectal cancer Neg Hx    Stomach cancer Neg Hx     Social History   Occupational History   Occupation: retired  Tobacco Use   Smoking status: Never   Smokeless tobacco: Never  Vaping Use   Vaping status: Never Used  Substance and Sexual Activity   Alcohol use: No   Drug use: No   Sexual activity: Not on file    Not on File   Outpatient Medications Prior to Visit  Medication Sig Dispense Refill   Artificial Tear Ointment (ARTIFICIAL TEARS) ointment Place 1 application into both eyes at bedtime.     buPROPion (WELLBUTRIN XL) 300 MG 24 hr tablet Take 300 mg by mouth daily.     carboxymethylcellulose (REFRESH PLUS) 0.5 % SOLN Place 1 drop into both eyes 3 (three) times daily as needed (for dry eyes).     gabapentin  (NEURONTIN ) 100 MG capsule Take 1 capsule (100 mg total) by mouth 2 (two) times daily. 60 capsule 2   ipratropium-albuterol  (DUONEB) 0.5-2.5 (3) MG/3ML SOLN Take 3 mLs by nebulization every 6 (six) hours as needed (shortness of breath, wheezing, cough). 360 mL 2   pantoprazole (PROTONIX) 40 MG tablet Take 40 mg by mouth daily.     rosuvastatin (CRESTOR) 40 MG tablet TAKE ONE-HALF TABLET BY MOUTH ONCE A DAY FOR CHOLESTEROL  sildenafil (VIAGRA) 100 MG tablet Take 50 mg by mouth as needed.     Tiotropium Bromide-Olodaterol (STIOLTO RESPIMAT ) 2.5-2.5 MCG/ACT AERS Inhale 2 puffs into the lungs daily. 4 g 5   loratadine (CLARITIN) 10 MG tablet Take 10 mg by mouth daily as needed. (Patient not taking: Reported on 01/05/2024)     No facility-administered medications prior to visit.    Review of Systems  Constitutional:  Negative for chills, diaphoresis, fever, malaise/fatigue and weight loss.  HENT:  Negative  for congestion.   Respiratory:  Negative for cough, hemoptysis, sputum production, shortness of breath and wheezing.        Hoarseness  Cardiovascular:  Negative for chest pain, palpitations and leg swelling.     Objective:   Vitals:   01/05/24 1028  BP: (!) 154/88  Temp: 98.6 F (37 C)  SpO2: 94%  Weight: 203 lb 9.6 oz (92.4 kg)  Height: 5' 9 (1.753 m)   SpO2: 94 %  Physical Exam: General: Well-appearing, no acute distress HENT: , AT, hoarseness Eyes: EOMI, no scleral icterus Respiratory: Clear to auscultation bilaterally.  No crackles, wheezing or rales Cardiovascular: RRR, -M/R/G, no JVD Extremities:-Edema,-tenderness Neuro: AAO x4, CNII-XII grossly intact Psych: Normal mood, normal affect  Data Reviewed:  Imaging: CT Chest 09/28/20 - Right upper lobe nodule ~1cm with diffuse scattered thin walled cysts throughout lungs bilaterally     CT Chest 01/09/21 - Unchanged cystic/bronchiectatic lesions. RUL nodule similar size ~1cm  CT Chest 07/30/21 - Interval improved in right upper lobe nodules. Similar scattered blebs and bullae in both lungs.   PFT: 03/09/21 FVC 3.28 (87%) FEV1 2.54 (90%) Ratio 73  TLC 85% DLCO 90% Interpretation: Normal spirometry  Labs: CBC No results found for: WBC, RBC, HGB, HCT, PLT, MCV, MCH, MCHC, RDW, LYMPHSABS, MONOABS, EOSABS, BASOSABS     Assessment & Plan:   Discussion: 78 year old male never smoker with diffuse cystic lung disease, solitary nodules and OSA s/p Inspire who presents for follow-up. Persistent unchanged cough. Off inhalers and remains hoarse which is likely driven by his chronic cough. On nebulizers for asthma control. Titrate gabapentin  up. Will further work-up with CT scan and if negative will schedule PFTs prior to next visit  Mild persistent asthma/shortness of breath Chronic cough - persistent --Continue nebulizer with Duonebs twice a day --CONTINUE Albuterol  AS NEEDED for shortness  of breath or wheezing. This is RESCUE inhaler --Increase gabapentin  200 mg twice a day for possible neurogenic cough --ORDER CT Chest without contrast to rule out parenchymal causes for cough  Asthma Action Plan Use albuterol  2 puffs 3-4 times a day for worsening shortness of breath, wheezing and cough. If you symptoms do not improve in 24-48 hours, please our office for evaluation and/or prednisone taper.  Hoarseness ENT eval neg 01/2022. Has been off inhalers since 02/2022 with no improvement in hoarseness  Diffuse cystic lung disease Low suspicion for LAM, PLCH, BHD. LIP in differential --Neg serologic testing: HIV, antinuclear antibody, anti-Ro/SSA, anti-La/SSB, rheumatoid factor --CT scan as noted above  Hypertension - measured in-office --Patient not on anti-hypertensive medications --Will discuss with primary care  Health Maintenance Immunization History  Administered Date(s) Administered    sv, Bivalent, Protein Subunit Rsvpref,pf [place]) 01/24/2022   Fluad Quad(high Dose 65+) 11/19/2019, 11/15/2020   H1N1 03/10/2008   INFLUENZA, HIGH DOSE SEASONAL PF 11/21/2021   Influenza Split 11/12/2014   Influenza, Quadrivalent, Recombinant, Inj, Pf 11/27/2017   Influenza,inj,Quad PF,6+ Mos 01/11/2016   Influenza,trivalent, recombinat, inj, PF 01/08/2017,  12/26/2017   Influenza-Unspecified 12/29/2001, 02/16/2004, 01/16/2005, 01/07/2006, 03/09/2007, 11/13/2007, 10/12/2008, 12/01/2009, 01/04/2011, 01/13/2012, 12/07/2012, 01/13/2015, 10/13/2018   Moderna Covid-19 Fall Seasonal Vaccine 46yrs & older 01/10/2022, 11/01/2022, 10/02/2023   Moderna Covid-19 Vaccine Bivalent Booster 22yrs & up 11/28/2020, 07/11/2021   Moderna Sars-Covid-2 Vaccination 03/18/2019, 04/15/2019, 12/16/2019, 05/22/2020   Pneumococcal Conjugate-13 09/02/2013   Pneumococcal Polysaccharide-23 08/28/2015   Pneumococcal-Unspecified 01/25/2002   Tdap 08/22/2011   Zoster Recombinant(Shingrix) 12/29/2018, 03/02/2019    Zoster, Live 12/09/2013   CT Lung Screen - not indicated. No significant tobacco history  No orders of the defined types were placed in this encounter.  No orders of the defined types were placed in this encounter.   No follow-ups on file.  I have spent a total time of 32-minutes on the day of the appointment including chart review, data review, collecting history, coordinating care and discussing medical diagnosis and plan with the patient/family. Past medical history, allergies, medications were reviewed. Pertinent imaging, labs and tests included in this note have been reviewed and interpreted independently by me.  Kayce Chismar Slater Staff, MD [facility]

## 2024-01-05 NOTE — Patient Instructions (Signed)
 Mild persistent asthma/shortness of breath Chronic cough --Continue nebulizer with Duonebs twice a day --CONTINUE Albuterol  AS NEEDED for shortness of breath or wheezing. This is RESCUE inhaler --Increase gabapentin  200 mg twice a day for possible neurogenic cough

## 2024-03-03 ENCOUNTER — Ambulatory Visit (HOSPITAL_BASED_OUTPATIENT_CLINIC_OR_DEPARTMENT_OTHER): Admitting: Pulmonary Disease

## 2024-03-03 ENCOUNTER — Encounter (HOSPITAL_BASED_OUTPATIENT_CLINIC_OR_DEPARTMENT_OTHER): Payer: Self-pay | Admitting: Pulmonary Disease

## 2024-03-03 VITALS — BP 131/82 | HR 72 | Ht 69.0 in | Wt 206.9 lb

## 2024-03-03 DIAGNOSIS — J4530 Mild persistent asthma, uncomplicated: Secondary | ICD-10-CM | POA: Diagnosis not present

## 2024-03-03 DIAGNOSIS — J984 Other disorders of lung: Secondary | ICD-10-CM

## 2024-03-03 DIAGNOSIS — R490 Dysphonia: Secondary | ICD-10-CM

## 2024-03-03 MED ORDER — GABAPENTIN 300 MG PO CAPS: 300.0000 mg | ORAL_CAPSULE | Freq: Three times a day (TID) | ORAL | 1 refills | Status: AC

## 2024-03-03 NOTE — Patient Instructions (Signed)
 Mild persistent asthma/shortness of breath Chronic cough - persistent --STOP nebulizers and inhalers since ineffective --INCREASE gabapentin  to 300 mg twice a day for possible neurogenic cough  Hoarseness [facility] eval neg 01/2022. Has been off inhalers since 02/2022 with no improvement in hoarseness Will refer to ENT for second evaluation/opinion

## 2024-03-03 NOTE — Progress Notes (Signed)
 "   Subjective:   PATIENT ID: Dalton Kidd GENDER: male DOB: [birth year], MRN: [id number]   HPI  Chief Complaint  Patient presents with   Asthma   Reason for Visit: Follow-up  [name] is a 79 year old male never smoker with OSA s/p Inspire in 2018 who presents for follow-up  Synopsis: He was previously seen by Dr. Corrie at the [facility]. Listed pulmonary related problems include asthma, allergic rhinitis and sleep apnea. He was referred to [facility] in 2022 for CT with incidental cystic lung disease and right upper lobe nodule measuring 10mm. He previously had PFTs in 04/2020.   At baseline he is living independently. Able to perform regular activity including household and yard work. Denies shortness of breath or wheezing. Occasional cough, non-productive. He reports history reflux and regurgitation with some meals and he is on an anti-reflux daily that has helped resolve.   2022 - Established care with [facility]. Influenza in November 2023 - Well controlled asthma on Wixela. Hoarseness with inhaler. Switched to Symbicort  with spacer with no improvement. ENT work-up negative. No exacerbations this year.  2024 - Inhalers discontinued due to hoarseness. Tried Stiolto without improvement. Asthma stable off bronchodilators 2025 - No maintenance inhalers with no exacerbations however productive cough with white sputum returned. Unchanged hoarseness. Patient preferred nebulizers however unchanged symptoms on this regimen. Trial of gabapentin  started. Graduated from Speech therapy in March.  03/03/24 Since our last visit he had gabapentin  increased with some improvement with cough but persistent hoarseness. Reports he loses his voice with prolonged conversation. Has coughing with short spells that self resolve, occurs three days a week. He reports well controlled reflux on protonix daily. Denies nasal congestion.  Social History: [employer] - lived in [place] 3-5  months Tried cigarettes but never smoked. Never vaped. 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  Past Medical History:  Diagnosis Date   Depression    GERD (gastroesophageal reflux disease)    Hyperlipidemia    Sleep apnea      Family History  Problem Relation Age of Onset   Diabetes Mother    Colon cancer Brother    Sleep apnea Son    Esophageal cancer Neg Hx    Rectal cancer Neg Hx    Stomach cancer Neg Hx     Social History   Occupational History   Occupation: retired  Tobacco Use   Smoking status: Never   Smokeless tobacco: Never  Vaping Use   Vaping status: Never Used  Substance and Sexual Activity   Alcohol use: No   Drug use: No   Sexual activity: Not on file    Not on File   Outpatient Medications Prior to Visit  Medication Sig Dispense Refill   Artificial Tear Ointment (ARTIFICIAL TEARS) ointment Place 1 application into both eyes at bedtime.     buPROPion (WELLBUTRIN XL) 300 MG 24 hr tablet Take 300 mg by mouth daily.     carboxymethylcellulose (REFRESH PLUS) 0.5 % SOLN Place 1 drop into both eyes 3 (three) times daily as needed (for dry eyes).     loratadine (CLARITIN) 10 MG tablet Take 10 mg by mouth daily as needed.     pantoprazole (PROTONIX) 40 MG tablet Take 40 mg by mouth daily.     rosuvastatin (CRESTOR) 40 MG tablet TAKE ONE-HALF TABLET BY MOUTH ONCE A DAY FOR CHOLESTEROL  sildenafil (VIAGRA) 100 MG tablet Take 50 mg by mouth as needed.     gabapentin  (NEURONTIN ) 100 MG capsule Take 2 capsules (200 mg total) by mouth 3 (three) times daily. 60 capsule 2   ipratropium-albuterol  (DUONEB) 0.5-2.5 (3) MG/3ML SOLN Take 3 mLs by nebulization every 6 (six) hours as needed (shortness of breath, wheezing, cough). 360 mL 2   Tiotropium Bromide-Olodaterol (STIOLTO RESPIMAT ) 2.5-2.5 MCG/ACT AERS Inhale 2 puffs into the lungs daily. (Patient not  taking: Reported on 03/03/2024) 4 g 5   No facility-administered medications prior to visit.    Review of Systems  Constitutional:  Negative for chills, diaphoresis, fever, malaise/fatigue and weight loss.  HENT:  Negative for congestion.        Hoarseness  Respiratory:  Positive for cough. Negative for hemoptysis, sputum production, shortness of breath and wheezing.   Cardiovascular:  Negative for chest pain, palpitations and leg swelling.     Objective:   Vitals:   03/03/24 0915  BP: 131/82  Pulse: 72  SpO2: 99%  Weight: 206 lb 14.4 oz (93.8 kg)  Height: 5' 9 (1.753 m)   SpO2: 99 %  Physical Exam: General: Well-appearing, no acute distress HENT: [place], AT, mild erythema on posterior pharynx Eyes: EOMI, no scleral icterus Respiratory: Clear to auscultation bilaterally.  No crackles, wheezing or rales Cardiovascular: RRR, -M/R/G, no JVD Extremities:-Edema,-tenderness Neuro: AAO x4, CNII-XII grossly intact Psych: Normal mood, normal affect  Data Reviewed:  Imaging: CT Chest 09/28/20 - Right upper lobe nodule ~1cm with diffuse scattered thin walled cysts throughout lungs bilaterally     CT Chest 01/09/21 - Unchanged cystic/bronchiectatic lesions. RUL nodule similar size ~1cm  CT Chest 07/30/21 - Interval improved in right upper lobe nodules. Similar scattered blebs and bullae in both lungs.   PFT: 03/09/21 FVC 3.28 (87%) FEV1 2.54 (90%) Ratio 73  TLC 85% DLCO 90% Interpretation: Normal spirometry  Labs: CBC No results found for: WBC, RBC, HGB, HCT, PLT, MCV, MCH, MCHC, RDW, LYMPHSABS, MONOABS, EOSABS, BASOSABS     Assessment & Plan:   Discussion: 78 year old male never smoker never smoker with diffuse cystic lung disease, solitary nodules and OSA s/p Inspire who presents for follow-up. Improved cough on increased gabapentin  however persistent breakthrough cough and unchanged hoarseness. Will titrate gabapentin  up. Tolerating without  drowsiness. Counseled on surveillance of diffuse cystic lung disease  Mild persistent asthma/shortness of breath Chronic cough - improved  but persistent --STOP nebulizers and inhalers since ineffective --INCREASE gabapentin  to 300 mg twice a day for possible neurogenic cough  Hoarseness - persistent  [facility] eval neg 01/2022. Has been off inhalers since 02/2022 with no improvement in hoarseness Will refer to ENT for second evaluation/opinion  Diffuse cystic lung disease Low suspicion for LAM, PLCH, BHD. LIP in differential --Neg serologic testing: HIV, antinuclear antibody, anti-Ro/SSA, anti-La/SSB, rheumatoid factor --Due for CT chest in 2026  Health Maintenance Immunization History  Administered Date(s) Administered    sv, Bivalent, Protein Subunit Rsvpref,pf [place]) 01/24/2022   Fluad Quad(high Dose 65+) 11/19/2019, 11/15/2020   H1N1 03/10/2008   INFLUENZA, HIGH DOSE SEASONAL PF 11/21/2021   Influenza Split 11/12/2014   Influenza, Quadrivalent, Recombinant, Inj, Pf 11/27/2017   Influenza,inj,Quad PF,6+ Mos 01/11/2016   Influenza,trivalent, recombinat, inj, PF 01/08/2017, 12/26/2017   Influenza-Unspecified 12/29/2001, 02/16/2004, 01/16/2005, 01/07/2006, 03/09/2007, 11/13/2007, 10/12/2008, 12/01/2009, 01/04/2011, 01/13/2012, 12/07/2012, 01/13/2015, 10/13/2018   Moderna Covid-19 Vaccine Bivalent Booster 30yrs & up 11/28/2020   Moderna Sars-Covid-2 Vaccination 03/18/2019, 04/15/2019, 12/16/2019, 05/22/2020   Pneumococcal  Conjugate-13 09/02/2013   Pneumococcal Polysaccharide-23 08/28/2015   Pneumococcal-Unspecified 01/25/2002   Tdap 08/22/2011   Zoster Recombinant(Shingrix) 12/29/2018, 03/02/2019   Zoster, Live 12/09/2013   CT Lung Screen - not indicated. No significant tobacco history  Orders Placed This Encounter  Procedures   CT Chest Wo Contrast    Standing Status:   Future    Expiration Date:   03/03/2025    Scheduling Instructions:     Please schedule in May and  ensure appointment after visit    Preferred imaging location?:   [facility]   Ambulatory referral to ENT    Referral Priority:   Routine    Referral Type:   Consultation    Referral Reason:   Specialty Services Required    Requested Specialty:   Otolaryngology    Number of Visits Requested:   1   Meds ordered this encounter  Medications   gabapentin  (NEURONTIN ) 300 MG capsule    Sig: Take 1 capsule (300 mg total) by mouth 3 (three) times daily.    Dispense:  270 capsule    Refill:  1    Return in about 4 months (around 07/01/2024) for after CT scan in May.  I have spent a total time of 30-minutes on the day of the appointment including chart review, data review, collecting history, coordinating care and discussing medical diagnosis and plan with the patient/family. Past medical history, allergies, medications were reviewed. Pertinent imaging, labs and tests included in this note have been reviewed and interpreted independently by me.  Jennessy Sandridge Slater Staff, MD [facility]     "

## 2024-05-12 IMAGING — CT CT CHEST W/O CM
2 of 4 series · 15 of 36 positions shown, 18 images · non-contrast
Comparison: Previous studies including the examination of
01/09/2021

CLINICAL DATA: Follow-up of lung nodule



[Series 2: thorax · axial · 0.69mm/px · z∈[-310,-70]mm · 12 of 142 slices shown, 15 images]
[im 11/142  mediastinal]
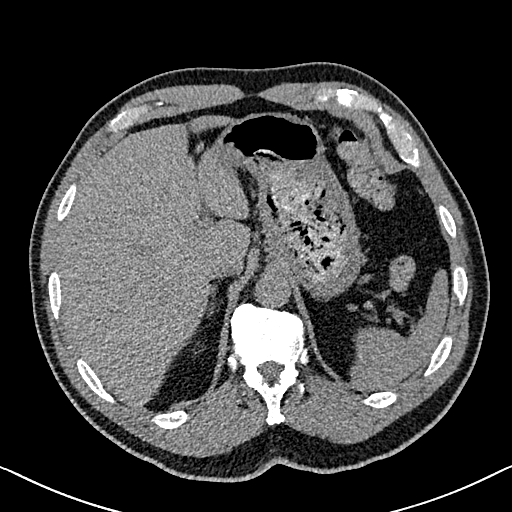
[im 11/142  lung]
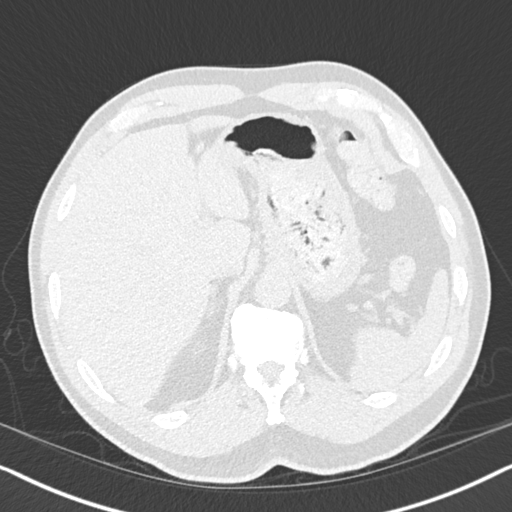
[im 22/142  lung]
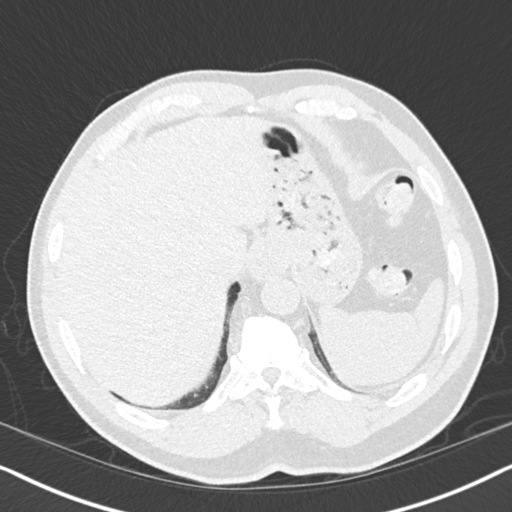
[im 33/142  lung]
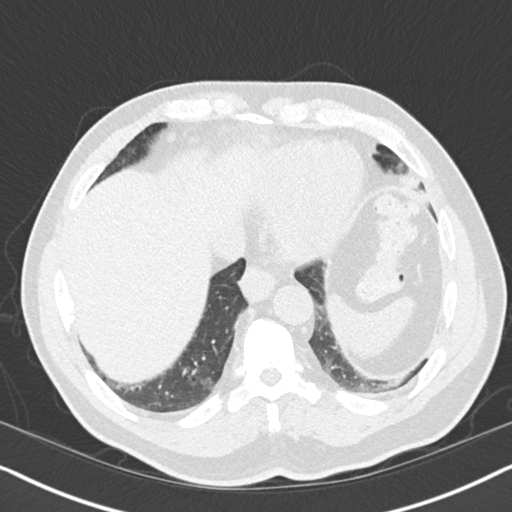
[im 44/142  lung]
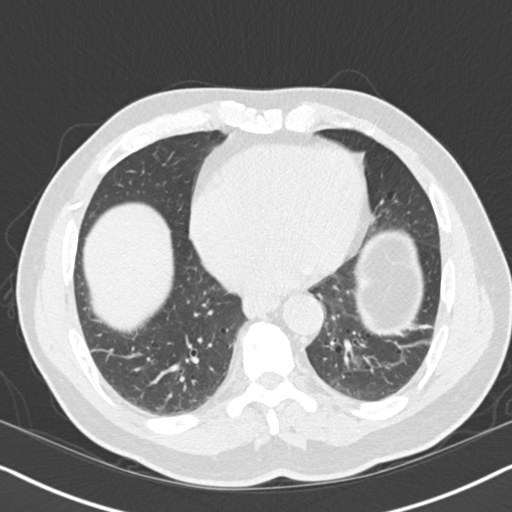
[im 55/142  mediastinal]
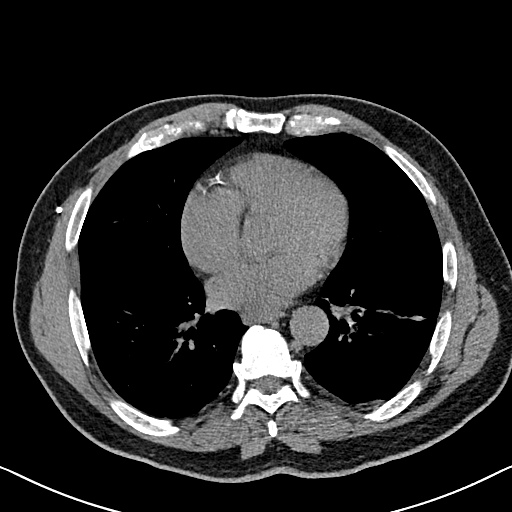
[im 55/142  lung]
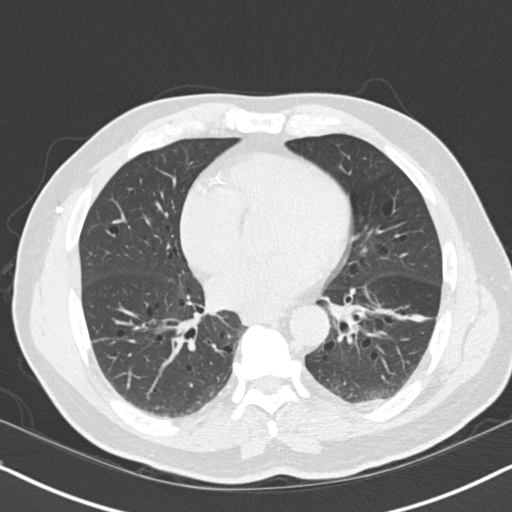
[im 66/142  lung]
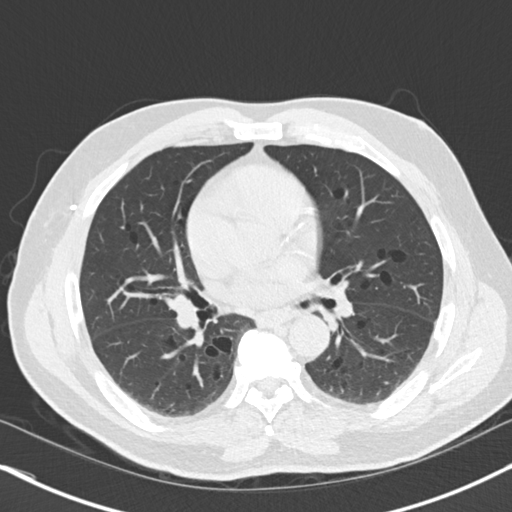
[im 76/142  lung]
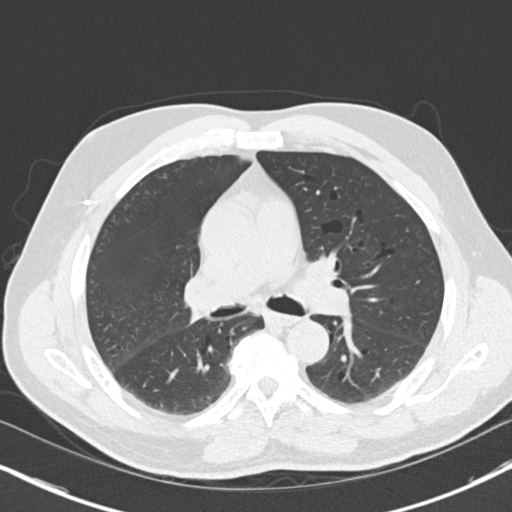
[im 87/142  lung]
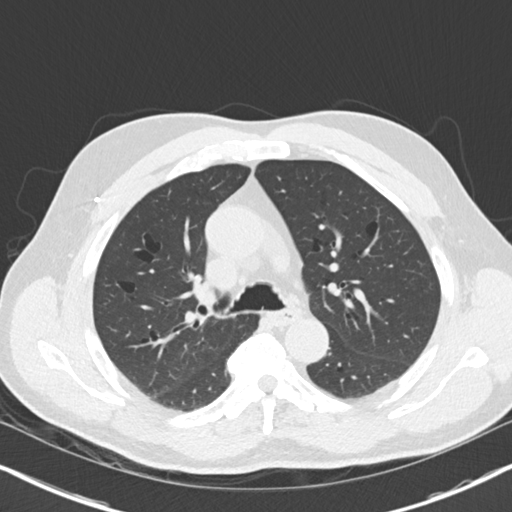
[im 98/142  mediastinal]
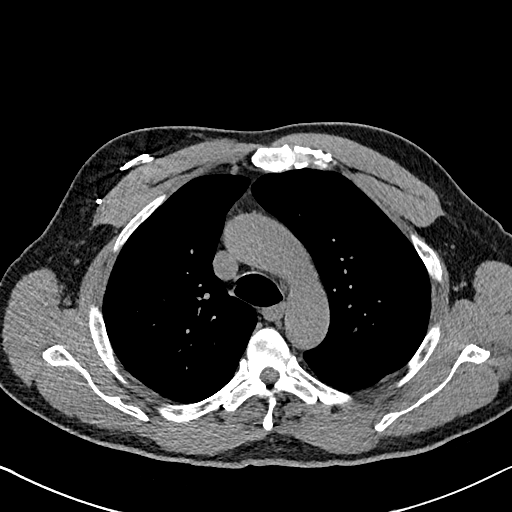
[im 98/142  lung]
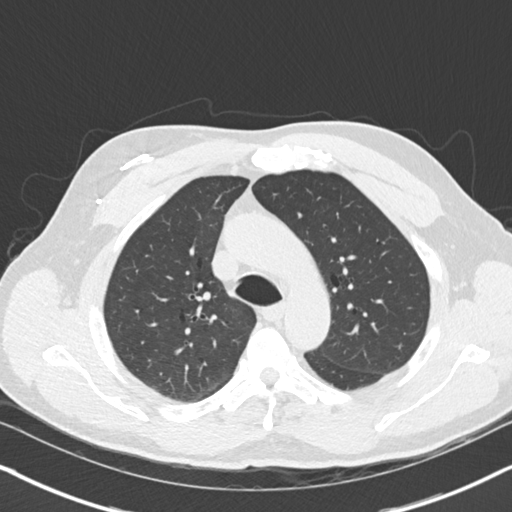
[im 109/142  lung]
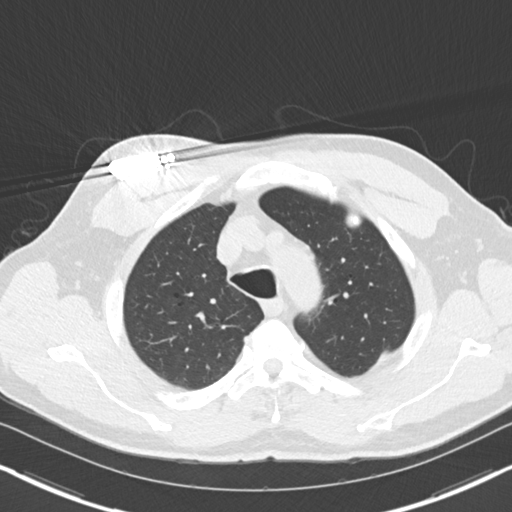
[im 120/142  lung]
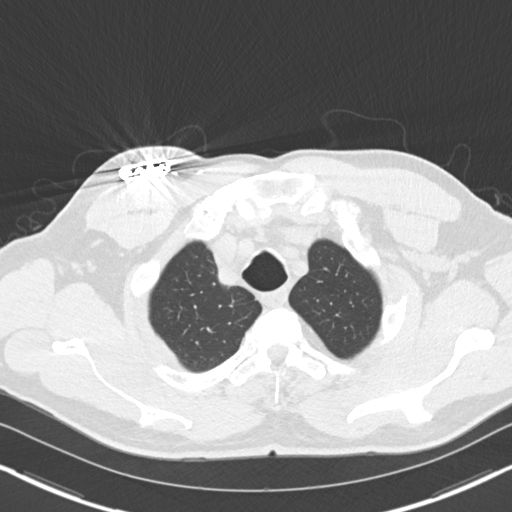
[im 131/142  lung]
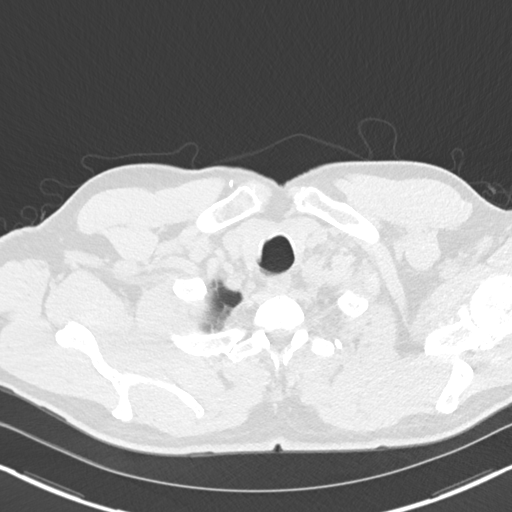

[Series 5: coronal · coronal · 0.59mm/px · 3 of 121 slices shown]
[im 25/121  lung]
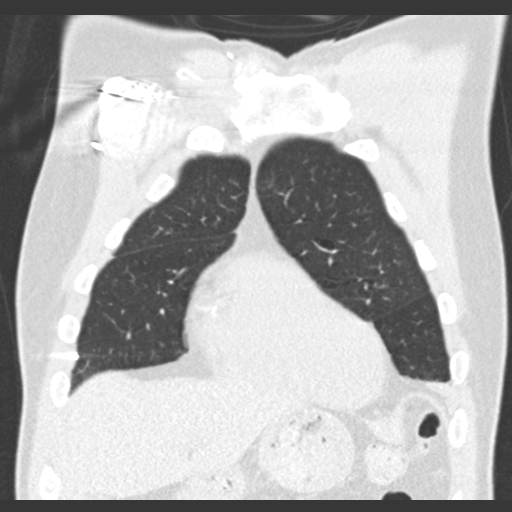
[im 49/121  lung]
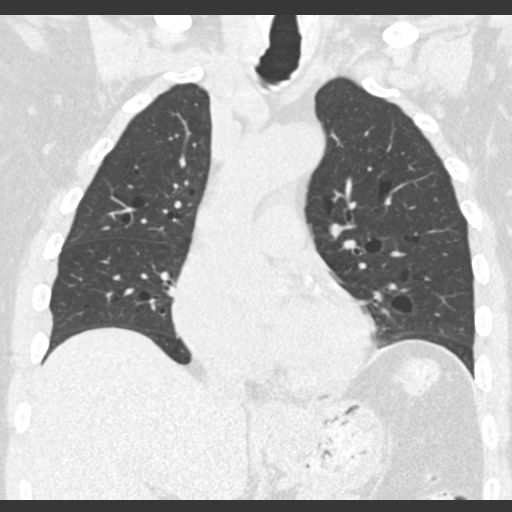
[im 73/121  lung]
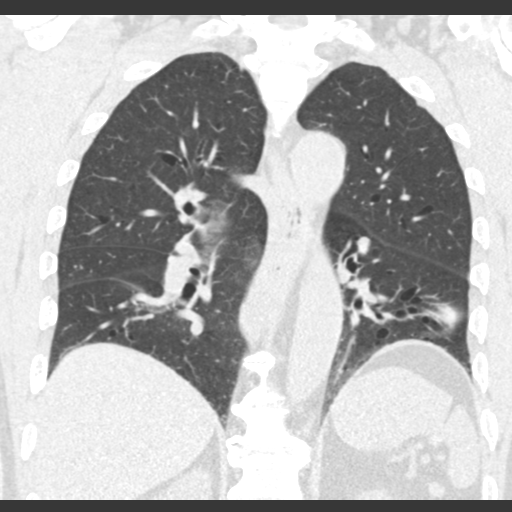

[15 of 36 positions shown; findings below may reference images not displayed]

FINDINGS: Cardiovascular: Coronary artery calcifications are seen. There is
ectasia of ascending thoracic aorta measuring 3.7 cm.

Mediastinum/Nodes: There are subcentimeter nodes in the mediastinum
with no significant change.

Lungs/Pleura: There are scattered blebs and bullae in both lungs.
There is interval clearing of small patchy nodular infiltrate seen
in the lateral aspect of right upper lobe suggesting resolution of
inflammatory or infectious process. There are no new nodules in the
lung fields. There are linear densities in both lower lung fields,
more so on the left side suggesting scarring or subsegmental
atelectasis with no significant interval change. There is mild
ectasia of bronchi. No new focal infiltrates are seen.

Upper Abdomen: Small hiatal hernia is seen. Scattered diverticula
are seen in the visualized colon.

Musculoskeletal: Small sclerotic densities in few bilateral ribs
appear stable. There is an electronic device in the right
infraclavicular region with leads extending into the lower neck and
right chest wall.
IMPRESSION: There is interval clearing of small patchy nodular densities in the
right upper lobe. There are no new nodules or new focal infiltrates.
No new significant lymphadenopathy is seen.

There are linear patchy densities in both lower lung fields, more so
on the left side with no significant change suggesting scarring or
subsegmental atelectasis.

COPD. Coronary artery calcifications are seen. Small hiatal hernia.

Other findings as described in the body of the report.

## 2024-07-06 ENCOUNTER — Ambulatory Visit (HOSPITAL_BASED_OUTPATIENT_CLINIC_OR_DEPARTMENT_OTHER): Admitting: Pulmonary Disease
